# Patient Record
Sex: Female | Born: 1950
Health system: Southern US, Community
[De-identification: ages and names within clinical notes are randomized; demographics above are authoritative.]

## PROBLEM LIST (undated history)

## (undated) DIAGNOSIS — M1990 Unspecified osteoarthritis, unspecified site: Secondary | ICD-10-CM

## (undated) DIAGNOSIS — S82209A Unspecified fracture of shaft of unspecified tibia, initial encounter for closed fracture: Secondary | ICD-10-CM

## (undated) DIAGNOSIS — Z98891 History of uterine scar from previous surgery: Secondary | ICD-10-CM

## (undated) DIAGNOSIS — H269 Unspecified cataract: Secondary | ICD-10-CM

## (undated) DIAGNOSIS — R000 Tachycardia, unspecified: Secondary | ICD-10-CM

## (undated) DIAGNOSIS — E785 Hyperlipidemia, unspecified: Secondary | ICD-10-CM

## (undated) DIAGNOSIS — C4490 Unspecified malignant neoplasm of skin, unspecified: Secondary | ICD-10-CM

## (undated) DIAGNOSIS — K5792 Diverticulitis of intestine, part unspecified, without perforation or abscess without bleeding: Secondary | ICD-10-CM

## (undated) DIAGNOSIS — IMO0002 Reserved for concepts with insufficient information to code with codable children: Secondary | ICD-10-CM

## (undated) DIAGNOSIS — C50919 Malignant neoplasm of unspecified site of unspecified female breast: Secondary | ICD-10-CM

## (undated) DIAGNOSIS — T7840XA Allergy, unspecified, initial encounter: Secondary | ICD-10-CM

## (undated) DIAGNOSIS — E669 Obesity, unspecified: Secondary | ICD-10-CM

## (undated) DIAGNOSIS — I10 Essential (primary) hypertension: Secondary | ICD-10-CM

## (undated) DIAGNOSIS — R011 Cardiac murmur, unspecified: Secondary | ICD-10-CM

## (undated) DIAGNOSIS — K219 Gastro-esophageal reflux disease without esophagitis: Secondary | ICD-10-CM

## (undated) HISTORY — DX: Unspecified malignant neoplasm of skin, unspecified: C44.90

## (undated) HISTORY — PX: OTHER SURGICAL HISTORY: SHX169

## (undated) HISTORY — DX: Reserved for concepts with insufficient information to code with codable children: IMO0002

## (undated) HISTORY — DX: History of uterine scar from previous surgery: Z98.891

## (undated) HISTORY — DX: Gastro-esophageal reflux disease without esophagitis: K21.9

## (undated) HISTORY — DX: Unspecified fracture of shaft of unspecified tibia, initial encounter for closed fracture: S82.209A

## (undated) HISTORY — DX: Tachycardia, unspecified: R00.0

## (undated) HISTORY — DX: Cardiac murmur, unspecified: R01.1

## (undated) HISTORY — DX: Malignant neoplasm of unspecified site of unspecified female breast: C50.919

## (undated) HISTORY — DX: Allergy, unspecified, initial encounter: T78.40XA

## (undated) HISTORY — DX: Essential (primary) hypertension: I10

## (undated) HISTORY — DX: Unspecified cataract: H26.9

## (undated) HISTORY — DX: Obesity, unspecified: E66.9

## (undated) HISTORY — PX: DILATION AND CURETTAGE OF UTERUS: SHX78

## (undated) HISTORY — DX: Hyperlipidemia, unspecified: E78.5

## (undated) HISTORY — DX: Unspecified osteoarthritis, unspecified site: M19.90

## (undated) HISTORY — DX: Diverticulitis of intestine, part unspecified, without perforation or abscess without bleeding: K57.92

---

## 1981-05-18 DIAGNOSIS — Z98891 History of uterine scar from previous surgery: Secondary | ICD-10-CM

## 1981-05-18 HISTORY — DX: History of uterine scar from previous surgery: Z98.891

## 1995-10-14 DIAGNOSIS — IMO0001 Reserved for inherently not codable concepts without codable children: Secondary | ICD-10-CM

## 1995-10-14 HISTORY — DX: Reserved for inherently not codable concepts without codable children: IMO0001

## 1996-01-12 DIAGNOSIS — C50919 Malignant neoplasm of unspecified site of unspecified female breast: Secondary | ICD-10-CM

## 1996-01-12 HISTORY — DX: Malignant neoplasm of unspecified site of unspecified female breast: C50.919

## 1996-01-12 HISTORY — PX: BREAST BIOPSY: SHX20

## 1997-10-13 HISTORY — PX: MASTECTOMY: SHX3

## 2006-10-13 HISTORY — PX: COLONOSCOPY: SHX174

## 2010-06-04 ENCOUNTER — Ambulatory Visit: Admission: RE | Admit: 2010-06-04 | Discharge: 2010-06-04 | Payer: Self-pay | Admitting: Gynecologic Oncology

## 2010-06-13 DIAGNOSIS — IMO0002 Reserved for concepts with insufficient information to code with codable children: Secondary | ICD-10-CM

## 2010-06-13 HISTORY — PX: LAPAROSCOPIC HYSTERECTOMY: SHX1926

## 2010-06-13 HISTORY — DX: Reserved for concepts with insufficient information to code with codable children: IMO0002

## 2010-06-25 ENCOUNTER — Encounter: Payer: Self-pay | Admitting: Obstetrics & Gynecology

## 2010-06-25 ENCOUNTER — Ambulatory Visit (HOSPITAL_COMMUNITY): Admission: RE | Admit: 2010-06-25 | Discharge: 2010-06-26 | Payer: Self-pay | Admitting: Obstetrics & Gynecology

## 2010-08-01 ENCOUNTER — Ambulatory Visit: Admission: RE | Admit: 2010-08-01 | Discharge: 2010-08-01 | Payer: Self-pay | Admitting: Gynecologic Oncology

## 2010-12-26 LAB — CBC
Hemoglobin: 12.2 g/dL (ref 12.0–15.0)
Hemoglobin: 14.1 g/dL (ref 12.0–15.0)
MCH: 30.8 pg (ref 26.0–34.0)
MCV: 88.5 fL (ref 78.0–100.0)
RBC: 3.95 MIL/uL (ref 3.87–5.11)
RBC: 4.58 MIL/uL (ref 3.87–5.11)
WBC: 20.5 10*3/uL — ABNORMAL HIGH (ref 4.0–10.5)

## 2010-12-26 LAB — COMPREHENSIVE METABOLIC PANEL
ALT: 17 U/L (ref 0–35)
AST: 19 U/L (ref 0–37)
CO2: 27 mEq/L (ref 19–32)
Chloride: 103 mEq/L (ref 96–112)
GFR calc Af Amer: >60 mL/min (ref >60)
GFR calc non Af Amer: >60 mL/min (ref >60)
Potassium: 3.9 mEq/L (ref 3.5–5.1)
Sodium: 139 mEq/L (ref 135–145)
Total Bilirubin: 0.6 mg/dL (ref 0.3–1.2)

## 2010-12-26 LAB — SURGICAL PCR SCREEN: Staphylococcus aureus: NEGATIVE

## 2010-12-26 LAB — TYPE AND SCREEN

## 2010-12-26 LAB — DIFFERENTIAL
Basophils Absolute: 0 10*3/uL (ref 0.0–0.1)
Eosinophils Absolute: 0.2 10*3/uL (ref 0.0–0.7)
Eosinophils Relative: 3 % (ref 0–5)

## 2011-07-30 ENCOUNTER — Ambulatory Visit: Payer: BC Managed Care – PPO | Attending: Gynecologic Oncology | Admitting: Gynecologic Oncology

## 2011-07-30 DIAGNOSIS — Z9079 Acquired absence of other genital organ(s): Secondary | ICD-10-CM | POA: Insufficient documentation

## 2011-07-30 DIAGNOSIS — Z90710 Acquired absence of both cervix and uterus: Secondary | ICD-10-CM | POA: Insufficient documentation

## 2011-07-30 DIAGNOSIS — Z7982 Long term (current) use of aspirin: Secondary | ICD-10-CM | POA: Insufficient documentation

## 2011-07-30 DIAGNOSIS — C549 Malignant neoplasm of corpus uteri, unspecified: Secondary | ICD-10-CM | POA: Insufficient documentation

## 2011-07-30 DIAGNOSIS — Z79899 Other long term (current) drug therapy: Secondary | ICD-10-CM | POA: Insufficient documentation

## 2011-08-01 NOTE — Consult Note (Signed)
Paige Mccann, Paige Mccann               ACCOUNT NO.:  [id number]  MEDICAL RECORD NO.:  [id number]  LOCATION:  GYN                          FACILITY:  [facility]  PHYSICIAN:  Paige Poitras A. Duard Brady, MD    DATE OF BIRTH:  [birth year]  DATE OF CONSULTATION:  07/30/2011 DATE OF DISCHARGE:                                CONSULTATION   Paige Mccann is a very pleasant 60 year old, who presented to Paige Mccann for pink vaginal discharge.  Evaluation included ultrasound, which demonstrated an heterogeneous echotexture of the uterus measuring approximately 2 cm.  Paige Mccann performed an endometrial biopsy that revealed a grade 1 endometrioid adenocarcinoma.  In September 2011, she underwent laparoscopic hysterectomy, BSO, repair of vaginal laceration. Intraoperative frozen section revealed an endometrial polyp with minimal to no myometrial invasion of the grade 1 lesion.  Final pathology was consistent with a FIGO grade 2 carcinoma confined to be inner half of the endometrium measuring 1 mm out of 15 mm.  There is no lymphovascular space involvement.  Peritoneal washings were negative.  We discussed her pathology and based on the clinical risk factors, she was a clinical stage IA grade 2 with minimal myometrial invasion.  She was dispositioned to close followup.  She comes in today, she is overall doing quite well.  She does complain of some back pain with her bowel movements.  She has had this since before her surgery.  After her bowel movements, the back pain does get better.  This is primarily in the morning when she wakes up.  She also notices some sciatic type pain in her left leg.  Again, is worse in the mornings when she wakes up, and then it seems to get a little bit better.  She did see Paige Mccann since we last saw her in August of last year and she states her exam and Pap smear were negative.  She denies any vaginal bleeding.  She does have occasional urinary frequency.  She occasionally sounds like she has  some clear discharge after her void is difficult to ascertain if she is not emptying her bladder completely and when she stands up after voiding of small amount of urine leaks.  She is regularly seen by her primary care physician.  Otherwise, offers no complaints.  MEDICATIONS: 1. Enalapril 20 mg daily. 2. Hydrochlorothiazide 25 mg one-half tablet in the morning. 3. Metoprolol 25 mg, one pill in the morning and one pill at dinner. 4. Prevacid 1 pill daily. 5. Baby aspirin. 6. TriCor 145 mg daily.  ALLERGIES: 1. CODEINE causes her to feel weird. 2. PREDNISONE makes her feel wired. 3. CIPROFLOXACIN causes dizziness. 4. NEOSPORIN causes itching and redness.  PHYSICAL EXAMINATION:  VITAL SIGNS:  Weight 224 pounds, which is up 3 pounds from her last visit.  Blood pressure 120/80, temperature 97.9, respirations 16. GENERAL:  Well-nourished, well-developed female, in no acute distress. NECK:  Supple.  There is no lymphadenopathy.  No thyromegaly. LUNGS:  Clear to auscultation bilaterally. CARDIOVASCULAR EXAM:  Regular rate and rhythm. ABDOMEN:  Shows well-healed surgical incisions.  Abdomen is soft, nontender, and nondistended.  There are no palpable masses or hepatosplenomegaly, but exam is somewhat limited by habitus.  Groins are negative for adenopathy. EXTREMITIES:  Shows 1 to 2+ nonpitting edema and equal bilaterally. PELVIC:  External genitalia is within normal limits.  The vagina is markedly atrophic.  The vaginal cuff is visualized.  There is no visible lesions.  ThinPrep Pap was submitted without difficulty. BIMANUAL EXAMINATION:  Reveals no masses or nodularity. RECTAL:  Confirms.  ASSESSMENT:  A 60 year old with a stage IA grade 2 endometrioid adenocarcinoma, status post laparoscopic hysterectomy, BSO, and hernia repair in September 2011, who clinically has no evidence of recurrent disease.  PLAN:  We will follow up results for Pap smear from today.  She will see [place] in  1 year and return to see Paige Mccann in 6 months.  She will continue following up with her other physicians as scheduled.     Paige Vecchiarelli A. Duard Brady, MD     PAG/MEDQ  D:  07/30/2011  T:  07/30/2011  Job:  [id number]  cc:   Paige Mccann, M.D. Fax: [phone]  Paige Mccann, R.N. [address] [place], [place] [address]  Paige Mccann  Paige Mccann  Electronically Signed by Paige Mccreedy MD on 08/01/2011 12:04:24 PM

## 2012-01-13 ENCOUNTER — Other Ambulatory Visit: Payer: Self-pay | Admitting: Physician Assistant

## 2012-07-19 ENCOUNTER — Encounter: Payer: Self-pay | Admitting: Gynecologic Oncology

## 2012-07-21 ENCOUNTER — Encounter: Payer: Self-pay | Admitting: Gynecologic Oncology

## 2012-07-21 ENCOUNTER — Ambulatory Visit: Payer: BC Managed Care – PPO | Attending: Gynecologic Oncology | Admitting: Gynecologic Oncology

## 2012-07-21 VITALS — BP 118/68 | HR 82 | Temp 98.1°F | Resp 20 | Ht 63.23 in | Wt 218.4 lb

## 2012-07-21 DIAGNOSIS — Z85828 Personal history of other malignant neoplasm of skin: Secondary | ICD-10-CM | POA: Insufficient documentation

## 2012-07-21 DIAGNOSIS — Z90710 Acquired absence of both cervix and uterus: Secondary | ICD-10-CM | POA: Insufficient documentation

## 2012-07-21 DIAGNOSIS — I10 Essential (primary) hypertension: Secondary | ICD-10-CM | POA: Insufficient documentation

## 2012-07-21 DIAGNOSIS — E669 Obesity, unspecified: Secondary | ICD-10-CM | POA: Insufficient documentation

## 2012-07-21 DIAGNOSIS — Z9079 Acquired absence of other genital organ(s): Secondary | ICD-10-CM | POA: Insufficient documentation

## 2012-07-21 DIAGNOSIS — Z853 Personal history of malignant neoplasm of breast: Secondary | ICD-10-CM | POA: Insufficient documentation

## 2012-07-21 DIAGNOSIS — C541 Malignant neoplasm of endometrium: Secondary | ICD-10-CM

## 2012-07-21 DIAGNOSIS — Z923 Personal history of irradiation: Secondary | ICD-10-CM | POA: Insufficient documentation

## 2012-07-21 DIAGNOSIS — Z79899 Other long term (current) drug therapy: Secondary | ICD-10-CM | POA: Insufficient documentation

## 2012-07-21 DIAGNOSIS — C549 Malignant neoplasm of corpus uteri, unspecified: Secondary | ICD-10-CM | POA: Insufficient documentation

## 2012-07-21 DIAGNOSIS — Z7982 Long term (current) use of aspirin: Secondary | ICD-10-CM | POA: Insufficient documentation

## 2012-07-21 NOTE — Progress Notes (Signed)
Consult Note: Gyn-Onc  Paige Mccann 61 y.o. female  CC:  Chief Complaint  Patient presents with  . Endometrial adenocarcinoma    Follow up    HPI: Paige Mccann is a very pleasant 61 year old, who presented to Dr. Rana Snare for pink vaginal discharge. Evaluation included ultrasound, which  demonstrated an heterogeneous echotexture of the uterus measuring approximately 2 cm. Dr. Rana Snare performed an endometrial biopsy that  revealed a grade 1 endometrioid adenocarcinoma. In September 2011, she underwent laparoscopic hysterectomy, BSO, repair of vaginal laceration. Intraoperative frozen section revealed an endometrial polyp with minimal to no myometrial invasion of the grade 1 lesion. Final pathology was consistent with a FIGO grade 2 carcinoma confined to be inner half of the endometrium measuring 1 mm out of 15 mm. There is no lymphovascular space involvement. Peritoneal washings were negative.   We discussed her pathology and based on the clinical risk factors, she was a clinical stage IA grade 2 with minimal myometrial invasion.  She was dispositioned to close followup.  Interval History:  She comes in today, she is overall doing quite well.   Review of Systems She does complain of some back pain with her bowel movements. She did see Dr. Rana Snare since we last saw her in October of last year and she states her exam and Pap smear were negative. She denies any vaginal bleeding. She does have occasional urinary frequency. She is exercising about 140 minutes per week. She is using a stationary bike. She has some tendon pain in the back of her left knee which limits how long she can walk or bike. She is taking chondriotin.  Her mother has Alzheimers and they are helping to care for her. She is regularly seen by her primary care physician. Otherwise, offers no complaints. MMG is up to date and her colonoscopy was 5 years ago.  10 point ROS is otherwise negative.  Current Meds:  Outpatient Encounter  Prescriptions as of 07/21/2012  Medication Sig Dispense Refill  . aspirin 81 MG tablet Take 81 mg by mouth daily.      . enalapril (VASOTEC) 20 MG tablet Take 20 mg by mouth daily.      . hydrochlorothiazide (HYDRODIURIL) 25 MG tablet Take 12.5 mg by mouth daily.      . Lansoprazole (PREVACID PO) Take 1 tablet by mouth daily.      . metoprolol tartrate (LOPRESSOR) 25 MG tablet Take 25 mg by mouth 2 (two) times daily.      Marland Kitchen DISCONTD: fenofibrate (TRICOR) 145 MG tablet Take 145 mg by mouth daily.        Allergy:  Allergies  Allergen Reactions  . Ciprofloxacin     Dizziness  . Codeine     "feel weird"  . Neosporin (Neomycin-Bacitracin Zn-Polymyx) Itching    redness  . Prednisone     "feel wired"    Social Hx:   History   Social History  . Marital Status: Married    Spouse Name: N/A    Number of Children: N/A  . Years of Education: N/A   Occupational History  . Not on file.   Social History Main Topics  . Smoking status: Never Smoker   . Smokeless tobacco: Not on file  . Alcohol Use: Yes     onec or twice a year  . Drug Use: No  . Sexually Active: Not on file   Other Topics Concern  . Not on file   Social History Narrative  . No narrative  on file    Past Surgical Hx:  Past Surgical History  Procedure Date  . Laparoscopic hysterectomy 06/2010    BSO, repair of vaginal laceration, hernia repair  . Dilation and curettage of uterus 1993, 2003  . Breast biopsy 01/1996  . Mastectomy 1999  . Skin cancer removal 2005, 2007    basal cell and squamous cell    Past Medical Hx:  Past Medical History  Diagnosis Date  . Endometrioid adenocarcinoma 06/2010    Stage IA, grade 2  . H/O cesarean section 05/18/1981  . Radiation 1997    for breast ca  . Breast cancer 01/1996    with recurrence in 1999  . Hypertension   . Obesity   . Rapid heart rate     Family Hx:  Family History  Problem Relation Age of Onset  . Adopted: Yes    Vitals:  Blood pressure 118/68,  pulse 82, temperature 98.1 F (36.7 C), temperature source Oral, resp. rate 20, height 5' 3.23" (1.606 m), weight 218 lb 6.4 oz (99.066 kg).  Physical Exam:  GENERAL: Well-nourished, well-developed female, in no acute distress.   NECK: Supple. There is no lymphadenopathy. No thyromegaly.   LUNGS: Clear to auscultation bilaterally.   CARDIOVASCULAR EXAM: Regular rate and rhythm.   ABDOMEN: Shows well-healed surgical incisions. Abdomen is soft, nontender, and nondistended. There are no palpable masses or  hepatosplenomegaly, but exam is somewhat limited by habitus. Groins are negative for adenopathy.   EXTREMITIES: Shows 1 to 2+ nonpitting edema and equal bilaterally.   PELVIC: External genitalia is within normal limits. The vagina is markedly atrophic. The vaginal cuff is visualized. There is no visible  lesions.    BIMANUAL EXAMINATION: Reveals no masses or nodularity.   RECTAL: Confirms.  Assessment/Plan: A 61 year old with a stage IA grade 2 endometrioid adenocarcinoma, status post laparoscopic hysterectomy, BSO, and hernia  repair in September 2011, who clinically has no evidence of recurrent disease.   PLAN: She will see [place] in 1 year and return to see Dr. Rana Snare in 6 months. She will continue following up with her other physicians as scheduled.  Cc: Drs. Derinda Late.   Cleda Mccreedy A., MD 07/21/2012, 2:33 PM

## 2012-07-21 NOTE — Patient Instructions (Signed)
RTC one year and follow up with Dr. Rana Snare in 6 months.

## 2013-07-28 ENCOUNTER — Ambulatory Visit: Payer: BC Managed Care – PPO | Attending: Gynecologic Oncology | Admitting: Gynecologic Oncology

## 2013-07-28 ENCOUNTER — Encounter: Payer: Self-pay | Admitting: Gynecologic Oncology

## 2013-07-28 VITALS — BP 152/83 | HR 90 | Temp 97.8°F | Resp 18 | Ht 63.23 in | Wt 224.0 lb

## 2013-07-28 DIAGNOSIS — Z90710 Acquired absence of both cervix and uterus: Secondary | ICD-10-CM | POA: Insufficient documentation

## 2013-07-28 DIAGNOSIS — C549 Malignant neoplasm of corpus uteri, unspecified: Secondary | ICD-10-CM | POA: Insufficient documentation

## 2013-07-28 DIAGNOSIS — I10 Essential (primary) hypertension: Secondary | ICD-10-CM | POA: Insufficient documentation

## 2013-07-28 DIAGNOSIS — Z853 Personal history of malignant neoplasm of breast: Secondary | ICD-10-CM | POA: Insufficient documentation

## 2013-07-28 DIAGNOSIS — C541 Malignant neoplasm of endometrium: Secondary | ICD-10-CM

## 2013-07-28 DIAGNOSIS — Z9079 Acquired absence of other genital organ(s): Secondary | ICD-10-CM | POA: Insufficient documentation

## 2013-07-28 NOTE — Patient Instructions (Addendum)
Followup with Dr. Rana Snare in 6 months and GYN oncology in one year.  Please call in June or July 2015 to schedule an appointment for October 2015.

## 2013-07-28 NOTE — Progress Notes (Signed)
Consult Note: Gyn-Onc  Paige Mccann 62 y.o. female  CC:  Chief Complaint  Patient presents with  . Endometrial adenocarcinoma    Follow up    HPI: Paige Mccann is a very pleasant 62 year old, who presented to Dr. Rana Snare for pink vaginal discharge. Evaluation included ultrasound, which  demonstrated an heterogeneous echotexture of the uterus measuring approximately 2 cm. Dr. Rana Snare performed an endometrial biopsy that  revealed a grade 1 endometrioid adenocarcinoma. In September 2011, she underwent laparoscopic hysterectomy, BSO, repair of vaginal laceration. Intraoperative frozen section revealed an endometrial polyp with minimal to no myometrial invasion of the grade 1 lesion. Final pathology was consistent with a FIGO grade 2 carcinoma confined to be inner half of the endometrium measuring 1 mm out of 15 mm. There is no lymphovascular space involvement. Peritoneal washings were negative.  We discussed her pathology and based on the clinical risk factors, she was a clinical stage IA grade 2 with minimal myometrial invasion. She was dispositioned to close followup  Interval History:  She is overall doing quite well. She's up-to-date on her mammograms. They're having increasing issues with her mother's dementia. The patient herself is adopted so there's no concerns for her from that perspective. She's having some increasing issues with arthritis in her left hip primarily with long walks her walking in the mall. This has been going on for about a year and is not significantly gotten worse. She has not experienced any pain when she is walking around her home.  Review of Systems  Constitutional: Denies fever. Skin: No rash, sores, jaundice, itching, or dryness.  Cardiovascular: No chest pain, shortness of breath, or edema  Pulmonary: No cough or wheeze.  Gastro Intestinal:  No nausea, vomiting, constipation, or diarrhea reported. No bright red blood per rectum or change in bowel movement.   Genitourinary: No frequency, urgency, or dysuria.  Denies vaginal bleeding and discharge.  Musculoskeletal: No myalgia, arthralgia, joint swelling or pain.  Neurologic: No weakness, numbness, or change in gait.  Psychology: No changes  Current Meds:  Outpatient Encounter Prescriptions as of 07/28/2013  Medication Sig Dispense Refill  . aspirin 81 MG tablet Take 81 mg by mouth daily.      . enalapril (VASOTEC) 20 MG tablet Take 20 mg by mouth daily.      Stephanie Acre (GLUCOSAMINE MSM COMPLEX) TABS Take 2 tablets by mouth 2 (two) times daily. Two pills morning and dinner      . hydrochlorothiazide (HYDRODIURIL) 25 MG tablet Take 12.5 mg by mouth daily.      . Lansoprazole (PREVACID PO) Take 1 tablet by mouth daily.      . metoprolol tartrate (LOPRESSOR) 25 MG tablet Take 25 mg by mouth 2 (two) times daily.      . Multiple Vitamins-Minerals (AIRBORNE PO) Take 1 tablet by mouth daily. 1 to 2 daily       No facility-administered encounter medications on file as of 07/28/2013.    Allergy:  Allergies  Allergen Reactions  . Ciprofloxacin     Dizziness  . Codeine     "feel weird"  . Neosporin [Neomycin-Bacitracin Zn-Polymyx] Itching    redness  . Prednisone     "feel wired"    Social Hx:   History   Social History  . Marital Status: Married    Spouse Name: N/A    Number of Children: N/A  . Years of Education: N/A   Occupational History  . Not on file.   Social History Main  Topics  . Smoking status: Never Smoker   . Smokeless tobacco: Not on file  . Alcohol Use: Yes     Comment: onec or twice a year  . Drug Use: No  . Sexual Activity: Not on file   Other Topics Concern  . Not on file   Social History Narrative  . No narrative on file    Past Surgical Hx:  Past Surgical History  Procedure Laterality Date  . Laparoscopic hysterectomy  06/2010    BSO, repair of vaginal laceration, hernia repair  . Dilation and curettage of uterus  1993, 2003  .  Breast biopsy  01/1996  . Mastectomy  1999  . Skin cancer removal  2005, 2007    basal cell and squamous cell    Past Medical Hx:  Past Medical History  Diagnosis Date  . Endometrioid adenocarcinoma 06/2010    Stage IA, grade 2  . H/O cesarean section 05/18/1981  . Radiation 1997    for breast ca  . Breast cancer 01/1996    with recurrence in 1999  . Hypertension   . Obesity   . Rapid heart rate     Oncology Hx:    Endometrial adenocarcinoma   06/25/2010 Initial Diagnosis Endometrial adenocarcinoma, IA grade 2 no LVSI    Family Hx:  Family History  Problem Relation Age of Onset  . Adopted: Yes    Vitals:  Blood pressure 152/83, pulse 90, temperature 97.8 F (36.6 C), temperature source Oral, resp. rate 18, height 5' 3.23" (1.606 m), weight 224 lb (101.606 kg).  Physical Exam: GENERAL: Well-nourished, well-developed female, in no acute distress.   NECK: Supple. There is no lymphadenopathy. No thyromegaly.   LUNGS: Clear to auscultation bilaterally.   CARDIOVASCULAR EXAM: Regular rate and rhythm.   ABDOMEN: Shows well-healed surgical incisions. Abdomen is soft, nontender, and nondistended. There are no palpable masses or hepatosplenomegaly, but exam is somewhat limited by habitus. Groins are negative for adenopathy.   EXTREMITIES: Shows 1 to 2+ nonpitting edema and equal bilaterally.   PELVIC: External genitalia is within normal limits. The vagina is markedly atrophic. The vaginal cuff is visualized. There is no visible lesions.   BIMANUAL EXAMINATION: Reveals no masses or nodularity.   RECTAL: Confirms.  Assessment/Plan:  A 62 year old with a stage IA grade 2 endometrioid adenocarcinoma, status post laparoscopic hysterectomy, BSO, and hernia repair in September 2011, who clinically has no evidence of recurrent disease.  PLAN: She will see [place] in 1 year and return to see Dr. Rana Snare in 6 months. She will continue following up with her other physicians as scheduled.  Cc:  Drs. Derinda Late.      Lamona Eimer A., MD 07/28/2013, 3:01 PM

## 2014-03-22 ENCOUNTER — Other Ambulatory Visit: Payer: Self-pay | Admitting: Obstetrics and Gynecology

## 2014-08-29 ENCOUNTER — Other Ambulatory Visit: Payer: Self-pay | Admitting: Obstetrics and Gynecology

## 2014-08-31 ENCOUNTER — Ambulatory Visit: Payer: BC Managed Care – PPO | Admitting: Gynecologic Oncology

## 2014-08-31 LAB — CYTOLOGY - PAP

## 2014-09-13 ENCOUNTER — Ambulatory Visit: Payer: BC Managed Care – PPO | Admitting: Gynecologic Oncology

## 2014-09-27 ENCOUNTER — Ambulatory Visit: Payer: BC Managed Care – PPO | Attending: Gynecologic Oncology | Admitting: Gynecologic Oncology

## 2014-09-27 ENCOUNTER — Encounter: Payer: Self-pay | Admitting: Gynecologic Oncology

## 2014-09-27 VITALS — BP 152/86 | HR 79 | Temp 97.5°F | Resp 20 | Ht 63.23 in | Wt 225.4 lb

## 2014-09-27 DIAGNOSIS — C541 Malignant neoplasm of endometrium: Secondary | ICD-10-CM

## 2014-09-27 NOTE — Progress Notes (Signed)
Patient scheduled to see [place] in error. She was just seen by her primary physician in November. She had a Pap smear with Dr. Corinna Capra over the summer that revealed atypical squamous cells of undetermined significance with negative HPV. He brought her back in November for repeat Pap smear that was normal. This appointment will be rescheduled total May or June 2016. She will not be charged for today's visit.

## 2015-03-13 ENCOUNTER — Encounter: Payer: Self-pay | Admitting: Gynecologic Oncology

## 2015-03-13 ENCOUNTER — Ambulatory Visit: Payer: BC Managed Care – PPO | Attending: Gynecologic Oncology | Admitting: Gynecologic Oncology

## 2015-03-13 VITALS — BP 144/79 | HR 87 | Temp 98.5°F | Resp 16 | Ht 63.0 in | Wt 232.6 lb

## 2015-03-13 DIAGNOSIS — Z08 Encounter for follow-up examination after completed treatment for malignant neoplasm: Secondary | ICD-10-CM | POA: Insufficient documentation

## 2015-03-13 DIAGNOSIS — Z90722 Acquired absence of ovaries, bilateral: Secondary | ICD-10-CM | POA: Insufficient documentation

## 2015-03-13 DIAGNOSIS — Z9010 Acquired absence of unspecified breast and nipple: Secondary | ICD-10-CM | POA: Insufficient documentation

## 2015-03-13 DIAGNOSIS — Z853 Personal history of malignant neoplasm of breast: Secondary | ICD-10-CM | POA: Insufficient documentation

## 2015-03-13 DIAGNOSIS — Z8542 Personal history of malignant neoplasm of other parts of uterus: Secondary | ICD-10-CM | POA: Diagnosis not present

## 2015-03-13 DIAGNOSIS — C541 Malignant neoplasm of endometrium: Secondary | ICD-10-CM

## 2015-03-13 DIAGNOSIS — Z90710 Acquired absence of both cervix and uterus: Secondary | ICD-10-CM | POA: Diagnosis not present

## 2015-03-13 DIAGNOSIS — Z923 Personal history of irradiation: Secondary | ICD-10-CM | POA: Insufficient documentation

## 2015-03-13 NOTE — Progress Notes (Signed)
Consult Note: Gyn-Onc  Paige Mccann 64 y.o. female  CC:  Chief Complaint  Patient presents with  . endometrial cancer    HPI: Paige Mccann is a very pleasant 64 year old, who presented to Dr. Corinna Capra for pink vaginal discharge. Evaluation included ultrasound, which demonstrated an heterogeneous echotexture of the uterus measuring approximately 2 cm. Dr. Corinna Capra performed an endometrial biopsy that revealed a grade 1 endometrioid adenocarcinoma. In September 2011, she underwent laparoscopic hysterectomy, BSO, repair of vaginal laceration. Intraoperative frozen section revealed an endometrial polyp with minimal to no myometrial invasion of the grade 1 lesion. Final pathology was consistent with a FIGO grade 2 carcinoma confined to be inner half of the endometrium measuring 1 mm out of 15 mm. There is no lymphovascular space involvement. Peritoneal washings were negative.   We discussed her pathology and based on the clinical risk factors, she was a clinical stage IA grade 2 with minimal myometrial invasion. She was dispositioned to close followup  Interval History:  I last saw her in December which time she was erroneously scheduled. Should a Pap smear with Dr. Corinna Capra in the summer of 2015 that revealed atypical squamous cells of undetermined significance with negative HPV. She had a repeat Pap smear in November that was negative.  Soon after we saw her she fell and broke her right leg slipping on a wet floor. 2 days after that her mother passed away. She did not require any surgery for her leg fracture she was seen by orthopedics and with immobilization and use of a walker it is healed well. She has gained weight as she is not quite as mobile as she was before that. She did have some spotting and bleeding about 2 days ago. She states her nails have gotten longer and she might have scratched herself. She is up-to-date on her mammograms. There are no new medical problems and her family.  Review of  Systems  Constitutional: Denies fever. Skin: No rash, sores, jaundice, itching, or dryness.  Cardiovascular: No chest pain, shortness of breath, or edema  Pulmonary: No cough or wheeze.  Gastro Intestinal:  No nausea, vomiting, constipation, or diarrhea reported. No bright red blood per rectum or change in bowel movement.  Genitourinary: No frequency, urgency, or dysuria.  Denies vaginal bleeding and discharge except as above.  Musculoskeletal: + Arthritis in both of her knees. The uterus to be that her left knee was worse in her right knee but the right knee is one that she recently broke. Neurologic: No weakness, numbness, or change in gait.  Psychology: No changes  Current Meds:  Outpatient Encounter Prescriptions as of 03/13/2015  Medication Sig  . aspirin 81 MG tablet Take 81 mg by mouth 2 (two) times daily.  . enalapril (VASOTEC) 20 MG tablet Take 20 mg by mouth daily.  Donnie Aho (GLUCOSAMINE MSM COMPLEX) TABS Take 2 tablets by mouth 2 (two) times daily. Two pills morning and dinner  . hydrochlorothiazide (HYDRODIURIL) 25 MG tablet Take 12.5 mg by mouth daily.  . Lansoprazole (PREVACID PO) Take 1 tablet by mouth daily.  . metoprolol tartrate (LOPRESSOR) 25 MG tablet Take 25 mg by mouth 2 (two) times daily.  . Multiple Vitamins-Minerals (AIRBORNE PO) Take 1 tablet by mouth daily. 1 to 2 daily   No facility-administered encounter medications on file as of 03/13/2015.    Allergy:  Allergies  Allergen Reactions  . Ciprofloxacin     Dizziness  . Codeine     "feel weird"  . Neosporin [  Neomycin-Bacitracin Zn-Polymyx] Itching    redness  . Prednisone     "feel wired"    Social Hx:   History   Social History  . Marital Status: Married    Spouse Name: N/A  . Number of Children: N/A  . Years of Education: N/A   Occupational History  . Not on file.   Social History Main Topics  . Smoking status: Never Smoker   . Smokeless tobacco: Not on file  .  Alcohol Use: Yes     Comment: onec or twice a year  . Drug Use: No  . Sexual Activity: Not on file   Other Topics Concern  . Not on file   Social History Narrative    Past Surgical Hx:  Past Surgical History  Procedure Laterality Date  . Laparoscopic hysterectomy  06/2010    BSO, repair of vaginal laceration, hernia repair  . Dilation and curettage of uterus  1993, 2003  . Breast biopsy  01/1996  . Mastectomy  1999  . Skin cancer removal  2005, 2007    basal cell and squamous cell    Past Medical Hx:  Past Medical History  Diagnosis Date  . Endometrioid adenocarcinoma 06/2010    Stage IA, grade 2  . H/O cesarean section 05/18/1981  . Radiation 1997    for breast ca  . Breast cancer 01/1996    with recurrence in 1999  . Hypertension   . Obesity   . Rapid heart rate     Oncology Hx:    Endometrial adenocarcinoma   06/25/2010 Initial Diagnosis Endometrial adenocarcinoma, IA grade 2 no LVSI    Family Hx:  Family History  Problem Relation Age of Onset  . Adopted: Yes    Vitals:  Blood pressure 144/79, pulse 87, temperature 98.5 F (36.9 C), temperature source Oral, resp. rate 16, height 5\' 3"  (1.6 m), weight 232 lb 9.6 oz (105.507 kg).  Physical Exam: GENERAL: Well-nourished, well-developed female, in no acute distress.   NECK: Supple. There is no lymphadenopathy. No thyromegaly.   LUNGS: Clear to auscultation bilaterally.   CARDIOVASCULAR EXAM: Regular rate and rhythm.   ABDOMEN: Shows well-healed surgical incisions. Abdomen is soft, nontender, and nondistended. There are no palpable masses or hepatosplenomegaly, but exam is somewhat limited by habitus. Groins are negative for adenopathy.   EXTREMITIES: Shows 1 to 2+ nonpitting edema and equal bilaterally.   PELVIC: External genitalia is within normal limits. The vagina is markedly atrophic. The vaginal cuff is visualized. There is no visible lesions.   BIMANUAL EXAMINATION: Reveals no masses or nodularity.    RECTAL: Confirms.   Assessment/Plan:  A 64 year old with a stage IA grade 2 endometrioid adenocarcinoma, status post laparoscopic hysterectomy, BSO, and hernia repair in September 2011, who clinically has no evidence of recurrent disease. I do not see any evidence of any external trauma or any lesions to explain the spotting that she had. She will notify us if she has any further episodes of bleeding.  PLAN: She will see [place] in 1 year and return to see Dr. Corinna Capra in 6 months. She will continue following up with her other physicians as scheduled.   Cc: Drs. Orvil Feil.      Nancy Marus A., MD 03/13/2015, 2:46 PM

## 2015-03-13 NOTE — Patient Instructions (Signed)
Follow up with Dr. Corinna Capra in 6 months and Dr. Alycia Rossetti in one year.

## 2015-03-15 ENCOUNTER — Ambulatory Visit: Payer: BC Managed Care – PPO | Admitting: Gynecologic Oncology

## 2015-04-20 ENCOUNTER — Telehealth: Payer: Self-pay | Admitting: *Deleted

## 2015-04-20 NOTE — Telephone Encounter (Signed)
Pt called requesting pap smear results.Notified pt pap smear normal

## 2015-05-02 ENCOUNTER — Ambulatory Visit: Payer: BC Managed Care – PPO | Attending: Internal Medicine | Admitting: Physical Therapy

## 2015-05-02 DIAGNOSIS — M25561 Pain in right knee: Secondary | ICD-10-CM | POA: Diagnosis not present

## 2015-05-02 DIAGNOSIS — Z9181 History of falling: Secondary | ICD-10-CM | POA: Diagnosis present

## 2015-05-02 DIAGNOSIS — R293 Abnormal posture: Secondary | ICD-10-CM | POA: Insufficient documentation

## 2015-05-02 NOTE — Patient Instructions (Signed)
   Saray Capasso PT, DPT, LAT, ATC  [facility] Phone: [phone]     

## 2015-05-02 NOTE — Therapy (Signed)
[place], [place], [address] Phone: [phone]   Fax:  [phone]  Physical Therapy Evaluation  Patient Details  Name: Paige Mccann MRN: [id number] Date of Birth: 14-Mar-1951 Referring Provider:  Burnard Bunting, MD  Encounter Date: 05/02/2015      PT End of Session - 05/02/15 1811    Visit Number 1   Number of Visits 12   Date for PT Re-Evaluation 06/13/15   PT Start Time 1630   PT Stop Time 1720   PT Time Calculation (min) 50 min   Activity Tolerance Patient tolerated treatment well   Behavior During Therapy [facility] for tasks assessed/performed      Past Medical History  Diagnosis Date  . Endometrioid adenocarcinoma 06/2010    Stage IA, grade 2  . H/O cesarean section 05/18/1981  . Radiation 1997    for breast ca  . Breast cancer 01/1996    with recurrence in 1999  . Hypertension   . Obesity   . Rapid heart rate     Past Surgical History  Procedure Laterality Date  . Laparoscopic hysterectomy  06/2010    BSO, repair of vaginal laceration, hernia repair  . Dilation and curettage of uterus  1993, 2003  . Breast biopsy  01/1996  . Mastectomy  1999  . Skin cancer removal  2005, 2007    basal cell and squamous cell    There were no vitals filed for this visit.  Visit Diagnosis:  Right knee pain - Plan: PT plan of care cert/re-cert  Abnormal posture - Plan: PT plan of care cert/re-cert  Risk for falls - Plan: PT plan of care cert/re-cert      Subjective Assessment - 05/02/15 1643    Subjective Pt is a 64 y.o F with CC RLE pain follow ina proximal tibia Jan 9th 2016. She reports that she doesn't feel it in the leg but reports feeling more pain from the osteoarthritis.    Patient is accompained by: Family member   Limitations Lifting;Standing;Walking;House hold activities   How long can you sit comfortably? unlimited   How long can you stand comfortably? 15 min   How long can you walk  comfortably? 20 min   Diagnostic tests x-ray 02/20/2015 reports that the break is looking good.   Patient Stated Goals to be able to walk with a SPC, to feel more balanced, increased strength   Currently in Pain? Yes   Pain Score 0-No pain  reports more weakness than pain   Pain Location Knee   Pain Orientation Right;Distal   Pain Descriptors / Indicators --  weakness   Pain Type Chronic pain   Pain Onset More than a month ago   Pain Frequency Intermittent   Aggravating Factors  prolong standing, stairs   Pain Relieving Factors sit down and rest            [facility] PT Assessment - 05/02/15 1650    Assessment   Medical Diagnosis R s/p Tibia fracture   Onset Date/Surgical Date 10/21/14   Hand Dominance Right   Next MD Visit 08/22/2015   Prior Therapy no   Precautions   Precautions None   Restrictions   Weight Bearing Restrictions No   Balance Screen   Has the patient fallen in the past 6 months No   Has the patient had a decrease in activity level because of a fear of falling?  No   Is the patient reluctant to leave  their home because of a fear of falling?  No   Home Environment   Living Environment Private residence   Living Arrangements Spouse/significant other   Available Help at Discharge Available 24 hours/day;Available PRN/intermittently   Type of Home House   Home Access Ramped entrance   Home Layout Two level   Alternate Level Stairs-Number of Steps 14   Alternate Level Stairs-Rails Right   Home Equipment [place] - single point;Walker - 2 wheels   Prior Function   Level of Independence Independent;Independent with basic ADLs   Vocation Retired   Leisure go to [employer], shopping, watch tv   Cognition   Overall Cognitive Status Within Functional Limits for tasks assessed   Observation/Other Assessments   Lower Extremity Functional Scale  31/80   Posture/Postural Control   Posture/Postural Control Postural limitations   Postural Limitations Rounded  Shoulders;Forward head   ROM / Strength   AROM / PROM / Strength AROM;PROM;Strength   AROM   AROM Assessment Site Knee   Right/Left Knee Right;Left   Right Knee Extension 122   Right Knee Flexion 0   Left Knee Extension 0   Left Knee Flexion 105   PROM   PROM Assessment Site Knee   Right/Left Knee Right;Left   Strength   Strength Assessment Site Knee   Right/Left Knee Right;Left   Right Knee Flexion 4/5   Right Knee Extension 4+/5   Left Knee Flexion 4+/5   Left Knee Extension 4+/5   Palpation   Patella mobility hypomobility in all planes with no pain during testing   Palpation comment tenderness located at the medial joint line with tenderness located at the pes anserine.    Special Tests    Special Tests Knee Special Tests   Knee Special tests  other;other2   other    Findings Negative  valgus   Side  Right   other   findings Negative  varus    Side Right  bil   Ambulation/Gait   Gait Pattern Step-through pattern;Antalgic  with RW   Berg Balance Test   Sit to Stand Able to stand using hands after several tries   Standing Unsupported Able to stand 2 minutes with supervision   Sitting with Back Unsupported but Feet Supported on Floor or Stool Able to sit safely and securely 2 minutes   Stand to Sit Controls descent by using hands   Transfers Able to transfer safely, definite need of hands   Standing Unsupported with Eyes Closed Able to stand 10 seconds with supervision   Standing Ubsupported with Feet Together Able to place feet together independently and stand 1 minute safely   From Standing, Reach Forward with Outstretched Arm Can reach forward >12 cm safely (5")   From Standing Position, Pick up Object from Floor Able to pick up shoe, needs supervision   From Standing Position, Turn to Look Behind Over each Shoulder Looks behind from both sides and weight shifts well   Turn 360 Degrees Able to turn 360 degrees safely one side only in 4 seconds or less   Standing  Unsupported, Alternately Place Feet on Step/Stool Able to complete 4 steps without aid or supervision   Standing Unsupported, One Foot in Front Able to plae foot ahead of the other independently and hold 30 seconds   Standing on One Leg Tries to lift leg/unable to hold 3 seconds but remains standing independently   Total Score 41  [place] Adult PT Treatment/Exercise - 05/02/15 1650    Knee/Hip Exercises: Standing   Other Standing Knee Exercises hip abd/ext x 10 ea  VC to keep to avoid external rotation during abd   Other Standing Knee Exercises sit to stand x 10  VC to avoid "plopping" and not to use hands   Knee/Hip Exercises: Supine   Bridges AROM;Strengthening;Both;1 set;5 reps  feared she may cramp in the R thigh                PT Education - 05/02/15 1810    Education provided Yes   Education Details evaluation findings, POC, Goals, HEP    Person(s) Educated Patient   Methods Explanation   Comprehension Verbalized understanding;Returned demonstration          PT Short Term Goals - 05/02/15 1818    PT SHORT TERM GOAL #1   Title pt will be I with basic HEP (05/23/2015)   Time 3   Period Weeks   Status New   PT SHORT TERM GOAL #2   Title pt will perform 10 MWT and obtain values to make long term goal for community amb    Time 3   Period Weeks   Status New           PT Long Term Goals - 05/02/15 1819    PT LONG TERM GOAL #1   Title upon discharge pt will be I all HEP given throughout (06/13/2015)   Time 6   Period Weeks   Status New   PT LONG TERM GOAL #2   Title pt will increase Berg score by > 5 points to assist with safety during walking and standing (06/13/2015)   Time 6   Period Weeks   Status New   PT LONG TERM GOAL #3   Title pt will be able to ambulate > 150 ft with LRAD for safety without having to rest or stop  to help with functional amb (06/13/2015)   Time 6   Period Weeks   Status New   PT LONG TERM GOAL #4    Title she will be able to maintain rhomber/tandem balance for > 20 sec to help with stability and safety (06/13/2015)   Time 6   Period Weeks   Status New   PT LONG TERM GOAL #5   Title pt will be able to verbalize and demonstrate techniques to avoid L knee/leg reinjury via gait mechanics, proper DME use, and HEP (06/13/2015)   Time 6   Period Weeks   Status New               Plan - 05/02/15 1814    Clinical Impression Statement Paige Mccann presents to OPPT with CC of R knee pain s/p a tibia fx that occure don 10/21/2014. She demonstrates functional AROM in the knee with functional strength during MMT with mild weakness with knee flexion due to pain during testing. She reported feeling unstable and has a fear of falling and currently ambulates with a RW. she scored a 41/56 on the berg indidcating mild/ moderate fall risk.  She would benefit from skilled physical therapy to improve functional mobility and safety by addressing the impairments below.    Pt will benefit from skilled therapeutic intervention in order to improve on the following deficits Decreased balance;Decreased activity tolerance;Decreased endurance;Difficulty walking;Pain;Improper body mechanics;Postural dysfunction;Decreased strength   Rehab Potential Good   PT Frequency 2x / week   PT Duration 6 weeks   PT Treatment/Interventions  ADLs/Self Care Home Management;Cryotherapy;Electrical Stimulation;Moist Heat;Ultrasound;Gait training;DME Instruction;Stair training;Functional mobility training;Therapeutic activities;Therapeutic exercise;Balance training;Neuromuscular re-education;Patient/family education;Manual techniques;Passive range of motion;Taping   PT Next Visit Plan assess response to HEP, CKC strengthening, balance training, gait training with LRAD modalities PRN, 10 MWT.    PT Home Exercise Plan see HEP   Consulted and Agree with Plan of Care Patient         Problem List Patient Active Problem List   Diagnosis Date  Noted  . Endometrial adenocarcinoma 07/21/2012   [name] PT, DPT, LAT, ATC  05/02/2015  6:34 PM    [facility] [address] [place], [place], [address] Phone: [phone]   Fax:  [phone]

## 2015-05-22 ENCOUNTER — Ambulatory Visit: Payer: BC Managed Care – PPO | Attending: Internal Medicine | Admitting: Physical Therapy

## 2015-05-22 DIAGNOSIS — Z9181 History of falling: Secondary | ICD-10-CM | POA: Insufficient documentation

## 2015-05-22 DIAGNOSIS — M25561 Pain in right knee: Secondary | ICD-10-CM | POA: Insufficient documentation

## 2015-05-22 DIAGNOSIS — R293 Abnormal posture: Secondary | ICD-10-CM | POA: Diagnosis present

## 2015-05-22 NOTE — Therapy (Signed)
[place], [place], [address] Phone: [phone]   Fax:  [phone]  Physical Therapy Treatment  Patient Details  Name: Paige Mccann MRN: [id number] Date of Birth: September 11, 1951 Referring Provider:  Burnard Bunting, MD  Encounter Date: 05/22/2015      PT End of Session - 05/22/15 1805    Visit Number 2   Number of Visits 12   Date for PT Re-Evaluation 06/13/15   PT Start Time 0037   PT Stop Time 1630   PT Time Calculation (min) 45 min   Activity Tolerance Patient tolerated treatment well   Behavior During Therapy [facility] for tasks assessed/performed      Past Medical History  Diagnosis Date  . Endometrioid adenocarcinoma 06/2010    Stage IA, grade 2  . H/O cesarean section 05/18/1981  . Radiation 1997    for breast ca  . Breast cancer 01/1996    with recurrence in 1999  . Hypertension   . Obesity   . Rapid heart rate     Past Surgical History  Procedure Laterality Date  . Laparoscopic hysterectomy  06/2010    BSO, repair of vaginal laceration, hernia repair  . Dilation and curettage of uterus  1993, 2003  . Breast biopsy  01/1996  . Mastectomy  1999  . Skin cancer removal  2005, 2007    basal cell and squamous cell    There were no vitals filed for this visit.  Visit Diagnosis:  Right knee pain  Abnormal posture  Risk for falls      Subjective Assessment - 05/22/15 1553    Subjective "I've been keeping up with my HEP but I haven't been doing them as much as I should be"   Currently in Pain? No/denies   Pain Score 0-No pain   Pain Location Knee   Pain Orientation Right;Distal                         [facility] Adult PT Treatment/Exercise - 05/22/15 1555    Static Standing Balance   Tandem Stance - Right Leg 30  x 2 sets   Tandem Stance - Left Leg 30  x 2 sets   Rhomberg - Eyes Opened 30  x 1 set   Rhomberg - Eyes Closed 30  x 2 sets   Knee/Hip Exercises: Aerobic    Nustep L 5 x 8 min   Knee/Hip Exercises: Standing   Heel Raises Both;1 set;15 reps   Knee Flexion AROM;Strengthening   Forward Step Up 1 set;10 reps;Step Height: 4"   Gait Training walking with SPC coordinating left foot strike with cane, x 30 ft   Other Standing Knee Exercises hip abd/ext x 10 ea   performed bil with 3#   Knee/Hip Exercises: Seated   Long Arc Quad AROM;Strengthening;Both;2 sets;10 reps  4#   Long Arc Quad Weight 3 lbs.   Marching AROM;Strengthening;Both;2 sets;10 reps  3#   Sit to [employer] 10 reps;2 sets;without UE support  with table lowered between sets   Knee/Hip Exercises: Supine   Bridges AROM;Strengthening;Both;2 sets;10 reps   Straight Leg Raises AROM;Strengthening;Both;1 set;10 reps                PT Education - 05/22/15 1805    Education provided Yes   Education Details HEP review   Person(s) Educated Patient   Methods Explanation   Comprehension Verbalized understanding  PT Short Term Goals - 05/22/15 1807    PT SHORT TERM GOAL #1   Title pt will be I with basic HEP (05/23/2015)   Time 3   Period Weeks   Status On-going   PT SHORT TERM GOAL #2   Title pt will perform 10 MWT and obtain values to make long term goal for community amb    Time 3   Period Weeks   Status On-going           PT Long Term Goals - 05/22/15 1808    PT LONG TERM GOAL #1   Title upon discharge pt will be I all HEP given throughout (06/13/2015)   Time 6   Period Weeks   Status On-going   PT LONG TERM GOAL #2   Title pt will increase Berg score by > 5 points to assist with safety during walking and standing (06/13/2015)   Time 6   Period Weeks   Status On-going   PT LONG TERM GOAL #3   Title pt will be able to ambulate > 150 ft with LRAD for safety without having to rest or stop  to help with functional amb (06/13/2015)   Time 6   Period Weeks   Status On-going   PT LONG TERM GOAL #4   Title she will be able to maintain rhomber/tandem balance  for > 20 sec to help with stability and safety (06/13/2015)   Time 6   Period Weeks   Status On-going   PT LONG TERM GOAL #5   Title pt will be able to verbalize and demonstrate techniques to avoid L knee/leg reinjury via gait mechanics, proper DME use, and HEP (06/13/2015)   Time 6   Period Weeks   Status On-going               Plan - 05/22/15 1806    Clinical Impression Statement Paige Mccann reports that she has been doing her HEP but hasn't been as consistent with it as she could be. She was able to perform all exercises today wihtout compliant and was able to hold tandem/rhomberg balance for 30 seconds with minimal postural sway. educated that next visit wanted to see her come in using her [facility] for support instead of her RW.    PT Next Visit Plan  CKC strengthening, balance training, gait training with LRAD modalities PRN, 10 MWT   PT Home Exercise Plan HEP review   Consulted and Agree with Plan of Care Patient        Problem List Patient Active Problem List   Diagnosis Date Noted  . Endometrial adenocarcinoma 07/21/2012   [name] PT, DPT, LAT, ATC  05/22/2015  6:12 PM    [facility] [address] [place], [place], [address] Phone: [phone]   Fax:  [phone]

## 2015-05-24 ENCOUNTER — Ambulatory Visit: Payer: BC Managed Care – PPO | Admitting: Physical Therapy

## 2015-05-24 DIAGNOSIS — M25561 Pain in right knee: Secondary | ICD-10-CM | POA: Diagnosis not present

## 2015-05-24 DIAGNOSIS — R293 Abnormal posture: Secondary | ICD-10-CM

## 2015-05-24 NOTE — Therapy (Signed)
[place], [place], [address] Phone: [phone]   Fax:  [phone]  Physical Therapy Treatment  Patient Details  Name: Paige Mccann MRN: [id number] Date of Birth: 10/10/1951 Referring Provider:  Burnard Bunting, MD  Encounter Date: 05/24/2015      PT End of Session - 05/24/15 1424    Visit Number 3   Number of Visits 12   Date for PT Re-Evaluation 06/13/15   PT Start Time 1330   PT Stop Time 1417   PT Time Calculation (min) 47 min   Activity Tolerance Patient tolerated treatment well   Behavior During Therapy [facility] for tasks assessed/performed      Past Medical History  Diagnosis Date  . Endometrioid adenocarcinoma 06/2010    Stage IA, grade 2  . H/O cesarean section 05/18/1981  . Radiation 1997    for breast ca  . Breast cancer 01/1996    with recurrence in 1999  . Hypertension   . Obesity   . Rapid heart rate     Past Surgical History  Procedure Laterality Date  . Laparoscopic hysterectomy  06/2010    BSO, repair of vaginal laceration, hernia repair  . Dilation and curettage of uterus  1993, 2003  . Breast biopsy  01/1996  . Mastectomy  1999  . Skin cancer removal  2005, 2007    basal cell and squamous cell    There were no vitals filed for this visit.  Visit Diagnosis:  Abnormal posture  Right knee pain      Subjective Assessment - 05/24/15 1331    Subjective .Rt low back pain  2/10.  She is weaning from walker.  Kasandra Knudsen here today.   Currently in Pain? Yes   Pain Score 2    Pain Location Back   Pain Orientation Right;Lower   Aggravating Factors  Sleeping in the bed wrong.   Pain Relieving Factors moving around change of position.                         [place] Adult PT Treatment/Exercise - 05/24/15 1338    Ambulation/Gait   Assistive device Straight cane  and no assist device, cues trunk rotation .  Practice carryi   Gait Comments 164+ feet walking without  rest   Knee/Hip Exercises: Standing   Heel Raises Both;2 sets   Knee Flexion AROM;Both;1 set;10 reps   Lateral Step Up Both;1 set;10 reps;Hand Hold: 2;Step Height: 4"   Forward Step Up 1 set;10 reps  Both   Knee/Hip Exercises: Supine   Bridges 10 reps  Both, cues breathing   Bridges with Ball Squeeze 10 reps  cues   Knee/Hip Exercises: Sidelying   Hip ABduction 10 reps  each   Hip ADduction 10 reps  both cues   Manual Therapy   Manual therapy comments retrograde instruction                  PT Short Term Goals - 05/24/15 1426    PT SHORT TERM GOAL #1   Title pt will be I with basic HEP (05/23/2015)   Time 3   Period Weeks   Status On-going   PT SHORT TERM GOAL #2   Title pt will perform 10 MWT and obtain values to make long term goal for community amb    Time 3   Period Weeks   Status On-going  PT Long Term Goals - 05/24/15 1427    PT LONG TERM GOAL #1   Title upon discharge pt will be I all HEP given throughout (06/13/2015)   Time 6   Period Weeks   Status On-going   PT LONG TERM GOAL #2   Title pt will increase Berg score by > 5 points to assist with safety during walking and standing (06/13/2015)   Time 6   Period Weeks   Status Unable to assess   PT LONG TERM GOAL #3   Title pt will be able to ambulate > 150 ft with LRAD for safety without having to rest or stop  to help with functional amb (06/13/2015)   Baseline can do with cane   Time 6   Period Weeks   Status Partially Met   PT LONG TERM GOAL #4   Title she will be able to maintain rhomber/tandem balance for > 20 sec to help with stability and safety (06/13/2015)   Time 6   Period Weeks   Status Unable to assess   PT LONG TERM GOAL #5   Title pt will be able to verbalize and demonstrate techniques to avoid L knee/leg reinjury via gait mechanics, proper DME use, and HEP (06/13/2015)   Time 6   Period Weeks   Status Unable to assess               Plan - 05/24/15 1425     Clinical Impression Statement LTG progress for gait distance partially met.  gait much improved with cues.  Still needs Hip strengthening.   PT Next Visit Plan  CKC strengthening, balance training, gait training with LRAD modalities PRN, 10 MWT   Consulted and Agree with Plan of Care Family member/caregiver;Patient   Family Member Consulted husband        Problem List Patient Active Problem List   Diagnosis Date Noted  . Endometrial adenocarcinoma 07/21/2012    [facility] 05/24/2015, 2:42 PM  [facility] [address] [place], [place], [address] Phone: [phone]   Fax:  [phone]     Melvenia Needles, PTA 05/24/2015 2:42 PM Phone: [phone] Fax: [phone]

## 2015-05-29 ENCOUNTER — Ambulatory Visit: Payer: BC Managed Care – PPO | Admitting: Physical Therapy

## 2015-05-29 DIAGNOSIS — M25561 Pain in right knee: Secondary | ICD-10-CM | POA: Diagnosis not present

## 2015-05-29 DIAGNOSIS — Z9181 History of falling: Secondary | ICD-10-CM

## 2015-05-29 DIAGNOSIS — R293 Abnormal posture: Secondary | ICD-10-CM

## 2015-05-29 NOTE — Therapy (Addendum)
[place], [place], [address] Phone: [phone]   Fax:  [phone]  Physical Therapy Treatment  Patient Details  Name: Paige Mccann MRN: [id number] Date of Birth: 10-Feb-1951 Referring Provider:  Burnard Bunting, MD  Encounter Date: 05/29/2015      PT End of Session - 05/29/15 1629    Visit Number 4   Number of Visits 12   Date for PT Re-Evaluation 06/13/15   Activity Tolerance Patient tolerated treatment well   Behavior During Therapy [facility] for tasks assessed/performed      Past Medical History  Diagnosis Date  . Endometrioid adenocarcinoma 06/2010    Stage IA, grade 2  . H/O cesarean section 05/18/1981  . Radiation 1997    for breast ca  . Breast cancer 01/1996    with recurrence in 1999  . Hypertension   . Obesity   . Rapid heart rate     Past Surgical History  Procedure Laterality Date  . Laparoscopic hysterectomy  06/2010    BSO, repair of vaginal laceration, hernia repair  . Dilation and curettage of uterus  1993, 2003  . Breast biopsy  01/1996  . Mastectomy  1999  . Skin cancer removal  2005, 2007    basal cell and squamous cell    There were no vitals filed for this visit.  Visit Diagnosis:  Abnormal posture  Right knee pain  Risk for falls      Subjective Assessment - 05/29/15 1552    Subjective "I have been having more pain in the L knee that started Saturday, with difficulty putting weight on it or bending the knee"   Currently in Pain? Yes   Pain Score 5    Pain Location Knee   Pain Orientation Left   Pain Descriptors / Indicators --  unstable, shifting   Pain Onset More than a month ago   Pain Frequency Constant   Aggravating Factors  walking            [facility] PT Assessment - 05/29/15 0001    Special Tests    Special Tests Meniscus Tests   Meniscus Tests McMurray Test;other;Apley's Distraction;Apley's Compression   McMurray Test   Findings Positive   Side Left   Apley's Compression   Findings Negative   Apley's Distraction   Findings Negative   other   Findings Positive   Side Left   Comments thessaly test                               PT Short Term Goals - 05/24/15 1426    PT SHORT TERM GOAL #1   Title pt will be I with basic HEP (05/23/2015)   Time 3   Period Weeks   Status On-going   PT SHORT TERM GOAL #2   Title pt will perform 10 MWT and obtain values to make long term goal for community amb    Time 3   Period Weeks   Status On-going           PT Long Term Goals - 05/24/15 1427    PT LONG TERM GOAL #1   Title upon discharge pt will be I all HEP given throughout (06/13/2015)   Time 6   Period Weeks   Status On-going   PT LONG TERM GOAL #2   Title pt will increase Berg score by > 5 points to assist with safety  during walking and standing (06/13/2015)   Time 6   Period Weeks   Status Unable to assess   PT LONG TERM GOAL #3   Title pt will be able to ambulate > 150 ft with LRAD for safety without having to rest or stop  to help with functional amb (06/13/2015)   Baseline can do with cane   Time 6   Period Weeks   Status Partially Met   PT LONG TERM GOAL #4   Title she will be able to maintain rhomber/tandem balance for > 20 sec to help with stability and safety (06/13/2015)   Time 6   Period Weeks   Status Unable to assess   PT LONG TERM GOAL #5   Title pt will be able to verbalize and demonstrate techniques to avoid L knee/leg reinjury via gait mechanics, proper DME use, and HEP (06/13/2015)   Time 6   Period Weeks   Status Unable to assess               Plan - 05/29/15 1629    Clinical Impression Statement Paige Mccann presents to therapy today with report of pain in the L knee that started on Saturday morning. she currently ambulates with her RW demonstrating an antalgic gait pattern keeping her left knee straight. She reports popping, clicking, and feeling un stable. Special  testing was positive with mcmurrys and thessaly which in combination with symptology may suggest possible meniscal patholgy. Educated if the pain persists it may benefit her to return to her orthopedic physician for further assessment. pt understood and  agreed with todays assessment.  Due to pain in the Left knee and inabilty to use LLE and placing increased stress on R, only peformed education today.    PT Next Visit Plan  CKC strengthening, balance training, gait training with LRAD modalities PRN, 10 MWT   PT Home Exercise Plan HEP review   Consulted and Agree with Plan of Care Patient;Family member/caregiver   Family Member Consulted husband        Problem List Patient Active Problem List   Diagnosis Date Noted  . Endometrial adenocarcinoma 07/21/2012   [name] PT, DPT, LAT, ATC  05/29/2015  4:37 PM    [facility] [address] [place], [place], [address] Phone: [phone]   Fax:  [phone]          PHYSICAL THERAPY DISCHARGE SUMMARY  Visits from Start of Care: 4  Current functional level related to goals / functional outcomes: FOTO 58%   Remaining deficits: Knee pain, decreased balance   Education / Equipment: HEP, theraband for strengthening  Plan: Patient agrees to discharge.  Patient goals were not met. Patient is being discharged due to the patient's request.  ?????         Panagiotis Oelkers PT, DPT, LAT, ATC  06/26/2015  2:07 PM

## 2015-05-31 ENCOUNTER — Ambulatory Visit: Payer: BC Managed Care – PPO | Admitting: Physical Therapy

## 2015-06-04 ENCOUNTER — Ambulatory Visit: Payer: BC Managed Care – PPO | Admitting: Physical Therapy

## 2015-06-07 ENCOUNTER — Ambulatory Visit: Payer: BC Managed Care – PPO

## 2015-06-12 ENCOUNTER — Encounter: Payer: BC Managed Care – PPO | Admitting: Physical Therapy

## 2015-06-13 ENCOUNTER — Other Ambulatory Visit: Payer: Self-pay | Admitting: Obstetrics and Gynecology

## 2015-06-13 DIAGNOSIS — M7989 Other specified soft tissue disorders: Secondary | ICD-10-CM

## 2015-06-13 DIAGNOSIS — N644 Mastodynia: Secondary | ICD-10-CM

## 2015-06-14 ENCOUNTER — Encounter: Payer: BC Managed Care – PPO | Admitting: Physical Therapy

## 2015-06-20 ENCOUNTER — Ambulatory Visit
Admission: RE | Admit: 2015-06-20 | Discharge: 2015-06-20 | Disposition: A | Discharge status: Home / Self Care | Payer: BC Managed Care – PPO | Source: Ambulatory Visit | Attending: Obstetrics and Gynecology | Admitting: Obstetrics and Gynecology

## 2015-06-20 DIAGNOSIS — N644 Mastodynia: Secondary | ICD-10-CM

## 2015-06-20 DIAGNOSIS — M7989 Other specified soft tissue disorders: Secondary | ICD-10-CM

## 2016-05-06 DIAGNOSIS — I10 Essential (primary) hypertension: Secondary | ICD-10-CM | POA: Diagnosis not present

## 2016-05-06 DIAGNOSIS — R7301 Impaired fasting glucose: Secondary | ICD-10-CM | POA: Diagnosis not present

## 2016-05-06 DIAGNOSIS — R269 Unspecified abnormalities of gait and mobility: Secondary | ICD-10-CM | POA: Diagnosis not present

## 2016-05-06 DIAGNOSIS — E784 Other hyperlipidemia: Secondary | ICD-10-CM | POA: Diagnosis not present

## 2016-05-06 DIAGNOSIS — Z6841 Body Mass Index (BMI) 40.0 and over, adult: Secondary | ICD-10-CM | POA: Diagnosis not present

## 2016-06-11 DIAGNOSIS — I10 Essential (primary) hypertension: Secondary | ICD-10-CM | POA: Diagnosis not present

## 2016-06-11 DIAGNOSIS — E782 Mixed hyperlipidemia: Secondary | ICD-10-CM | POA: Diagnosis not present

## 2016-06-11 DIAGNOSIS — R011 Cardiac murmur, unspecified: Secondary | ICD-10-CM | POA: Diagnosis not present

## 2016-08-26 ENCOUNTER — Encounter: Payer: Self-pay | Admitting: Gynecologic Oncology

## 2016-08-26 ENCOUNTER — Other Ambulatory Visit (HOSPITAL_COMMUNITY)
Admission: RE | Admit: 2016-08-26 | Discharge: 2016-08-26 | Disposition: A | Discharge status: Home / Self Care | Payer: Medicare Other | Source: Ambulatory Visit | Attending: Gynecologic Oncology | Admitting: Gynecologic Oncology

## 2016-08-26 ENCOUNTER — Other Ambulatory Visit: Payer: Self-pay | Admitting: Gynecologic Oncology

## 2016-08-26 ENCOUNTER — Ambulatory Visit: Payer: Medicare Other | Attending: Gynecologic Oncology | Admitting: Gynecologic Oncology

## 2016-08-26 VITALS — BP 166/78 | HR 66 | Temp 97.8°F | Resp 18 | Ht 63.0 in | Wt 236.4 lb

## 2016-08-26 DIAGNOSIS — Z9010 Acquired absence of unspecified breast and nipple: Secondary | ICD-10-CM | POA: Insufficient documentation

## 2016-08-26 DIAGNOSIS — S8291XD Unspecified fracture of right lower leg, subsequent encounter for closed fracture with routine healing: Secondary | ICD-10-CM | POA: Insufficient documentation

## 2016-08-26 DIAGNOSIS — I10 Essential (primary) hypertension: Secondary | ICD-10-CM | POA: Diagnosis not present

## 2016-08-26 DIAGNOSIS — C541 Malignant neoplasm of endometrium: Secondary | ICD-10-CM | POA: Diagnosis not present

## 2016-08-26 DIAGNOSIS — X58XXXD Exposure to other specified factors, subsequent encounter: Secondary | ICD-10-CM | POA: Insufficient documentation

## 2016-08-26 DIAGNOSIS — Z853 Personal history of malignant neoplasm of breast: Secondary | ICD-10-CM | POA: Insufficient documentation

## 2016-08-26 DIAGNOSIS — Z124 Encounter for screening for malignant neoplasm of cervix: Secondary | ICD-10-CM | POA: Diagnosis not present

## 2016-08-26 DIAGNOSIS — Z888 Allergy status to other drugs, medicaments and biological substances status: Secondary | ICD-10-CM | POA: Diagnosis not present

## 2016-08-26 DIAGNOSIS — Z9889 Other specified postprocedural states: Secondary | ICD-10-CM | POA: Insufficient documentation

## 2016-08-26 DIAGNOSIS — S83207D Unspecified tear of unspecified meniscus, current injury, left knee, subsequent encounter: Secondary | ICD-10-CM | POA: Insufficient documentation

## 2016-08-26 DIAGNOSIS — E669 Obesity, unspecified: Secondary | ICD-10-CM | POA: Diagnosis not present

## 2016-08-26 DIAGNOSIS — Z7982 Long term (current) use of aspirin: Secondary | ICD-10-CM | POA: Insufficient documentation

## 2016-08-26 DIAGNOSIS — Z90710 Acquired absence of both cervix and uterus: Secondary | ICD-10-CM | POA: Insufficient documentation

## 2016-08-26 DIAGNOSIS — Z8542 Personal history of malignant neoplasm of other parts of uterus: Secondary | ICD-10-CM

## 2016-08-26 DIAGNOSIS — Z79899 Other long term (current) drug therapy: Secondary | ICD-10-CM | POA: Insufficient documentation

## 2016-08-26 NOTE — Progress Notes (Signed)
Consult Note: Gyn-Onc  Paige Mccann 65 y.o. female  CC:  Chief Complaint  Patient presents with  . endometrial adenocarcinoma    follow up    HPI: Primary physician Dr. Joya Salm at [facility]; referring physician Dr. Louretta Shorten  Ms. Paige Mccann is a very pleasant 65 year old, who presented to Dr. Corinna Capra for pink vaginal discharge. Evaluation included ultrasound, which demonstrated an heterogeneous echotexture of the uterus measuring approximately 2 cm. Dr. Corinna Capra performed an endometrial biopsy that revealed a grade 1 endometrioid adenocarcinoma. In September 2011, she underwent laparoscopic hysterectomy, BSO, repair of vaginal laceration. Intraoperative frozen section revealed an endometrial polyp with minimal to no myometrial invasion of the grade 1 lesion. Final pathology was consistent with a FIGO grade 2 carcinoma confined to be inner half of the endometrium measuring 1 mm out of 15 mm. There is no lymphovascular space involvement. Peritoneal washings were negative.   We discussed her pathology and based on the clinical risk factors, she was a clinical stage IA grade 2 with minimal myometrial invasion. She was dispositioned to close followup  Interval History:  I last saw her in May 2016 at which time her exam was unremarkable. The last Pap smear I have is from November 2015 that was negative. The patient does state however that she saw Dr. Corinna Capra last year and had a negative exam and negative Pap smear. She's overall doing quite well. She does complain of some vaginal itching which is chronic and 1 on for several years and she uses steroid cream intermittently. Occasionally she'll have some bleeding if she scratches too hard. She will complete have occasional abdominal discomfort and low back pain before she has a bowel movement every morning. She has no issues with her bladder. She did break her right leg since we last saw her and she also has a torn meniscus in her left. She is under the care  of Dr. Dennie Maizes orthopedics. She is up-to-date on her mammograms and is due for one in January. There are no new medical problems and her family.  Review of Systems: Constitutional: Denies fever. Skin: Itching as above, tick bite over umbilicus.  Cardiovascular: No chest pain, shortness of breath, or edema  Pulmonary: No cough or wheeze.  Gastro Intestinal: Reporting intermittent lower abdominal soreness as above.  No nausea, vomiting, constipation, or diarrhea reported.  Genitourinary: Denies vaginal bleeding and discharge.  Musculoskeletal: Ortho issues as above.    Current Meds:  Outpatient Encounter Prescriptions as of 08/26/2016  Medication Sig Dispense Refill  . aspirin 81 MG tablet Take 81 mg by mouth 2 (two) times daily.    . enalapril (VASOTEC) 20 MG tablet Take 20 mg by mouth daily.    Donnie Aho (GLUCOSAMINE MSM COMPLEX) TABS Take 1 tablet by mouth 2 (two) times daily. Two pills morning and dinner    . hydrochlorothiazide (HYDRODIURIL) 25 MG tablet Take 12.5 mg by mouth daily.    . Lansoprazole (PREVACID PO) Take 1 tablet by mouth daily.    . metoprolol tartrate (LOPRESSOR) 25 MG tablet Take 25 mg by mouth 2 (two) times daily.    . Multiple Vitamins-Minerals (AIRBORNE) CHEW Chew 1 tablet by mouth as needed.    . [DISCONTINUED] Multiple Vitamins-Minerals (AIRBORNE PO) Take 1 tablet by mouth daily. 1 to 2 daily     No facility-administered encounter medications on file as of 08/26/2016.     Allergy:  Allergies  Allergen Reactions  . Cefdinir Other (See Comments)    weakness  .  Ciprofloxacin     Dizziness  . Codeine     "feel weird"  . Neosporin [Neomycin-Bacitracin Zn-Polymyx] Itching    redness  . Prednisone     "feel wired"    Social Hx:   Social History   Social History  . Marital status: Married    Spouse name: N/A  . Number of children: N/A  . Years of education: N/A   Occupational History  . Not on file.   Social History Main  Topics  . Smoking status: Never Smoker  . Smokeless tobacco: Never Used  . Alcohol use Yes     Comment: onec or twice a year  . Drug use: No  . Sexual activity: Not on file   Other Topics Concern  . Not on file   Social History Narrative  . No narrative on file    Past Surgical Hx:  Past Surgical History:  Procedure Laterality Date  . BREAST BIOPSY  01/1996  . [place], 2003  . LAPAROSCOPIC HYSTERECTOMY  06/2010   BSO, repair of vaginal laceration, hernia repair  . MASTECTOMY  1999  . skin cancer removal  2005, 2007   basal cell and squamous cell    Past Medical Hx:  Past Medical History:  Diagnosis Date  . Breast cancer ([place]) 01/1996   with recurrence in 1999  . Endometrioid adenocarcinoma 06/2010   Stage IA, grade 2  . H/O cesarean section 05/18/1981  . Hypertension   . Obesity   . Radiation 1997   for breast ca  . Rapid heart rate     Oncology Hx:    Endometrial adenocarcinoma ([place])   06/25/2010 Initial Diagnosis    Endometrial adenocarcinoma, IA grade 2 no LVSI       Family Hx:  Family History  Problem Relation Age of Onset  . Adopted: Yes    Vitals:  Blood pressure (!) 166/78, pulse 66, temperature 97.8 F (36.6 C), temperature source Oral, resp. rate 18, height 5\' 3"  (1.6 m), weight 236 lb 6.4 oz (107.2 kg), SpO2 100 %.  Physical Exam: GENERAL: Well-nourished, well-developed female, in no acute distress.   NECK: Supple. There is no lymphadenopathy. No thyromegaly.   LUNGS: Clear to auscultation bilaterally.   CARDIOVASCULAR EXAM: Regular rate and rhythm.   ABDOMEN: Shows well-healed surgical incisions. Abdomen is soft, nontender, and nondistended. There are no palpable masses or hepatosplenomegaly, but exam is somewhat limited by habitus. ? Small umbilical hernia. Groins are negative for adenopathy.   EXTREMITIES: Shows 1 to 2+ nonpitting edema and equal bilaterally.   PELVIC: External genitalia is within normal  limits. The vagina is markedly atrophic. The vaginal cuff is visualized. There is no visible lesions. Pap smear submitted without difficulty.  BIMANUAL EXAMINATION: Reveals no masses or nodularity.   RECTAL: Confirms.   Assessment/Plan:  A 65 year old with a stage IA grade 2 endometrioid adenocarcinoma, status post laparoscopic hysterectomy, BSO, and hernia repair in September 2011, who clinically has no evidence of recurrent disease.  We will follow-up in results of her Pap smear from today. As she is more than 5 years out release her from our clinic and she will follow-up with Dr. Corinna Capra for routine GYN care.  She was encouraged not to use steroid cream on the vulva as there is no visible lesion and long-term steroids can cause thinning of the skin. She can use other lotions for symptomatic relief. There is no visible lesion.  She will follow-up with  her other physicians as scheduled. She knows that we'll be happy to see her in the future should the need arise.      Nikelle Malatesta A., MD 08/26/2016, 3:09 PM

## 2016-08-26 NOTE — Patient Instructions (Signed)
Plan to follow up with your other physicians including Dr. Reynaldo Minium and Dr. Corinna Capra.  Please call for any questions or concerns.  No need for follow up with Dr. Alycia Rossetti at this time.

## 2016-08-28 ENCOUNTER — Telehealth: Payer: Self-pay

## 2016-08-28 LAB — CYTOLOGY - PAP: Diagnosis: NEGATIVE

## 2016-08-28 NOTE — Telephone Encounter (Signed)
Orders received from [facility] to contact the patient with her results being "normal". Attempted to contact the patient , no answer, left a detailed message. Are contact information was provided if additional questions arise.

## 2016-09-03 DIAGNOSIS — Z6841 Body Mass Index (BMI) 40.0 and over, adult: Secondary | ICD-10-CM | POA: Diagnosis not present

## 2016-09-03 DIAGNOSIS — R7301 Impaired fasting glucose: Secondary | ICD-10-CM | POA: Diagnosis not present

## 2016-09-03 DIAGNOSIS — I10 Essential (primary) hypertension: Secondary | ICD-10-CM | POA: Diagnosis not present

## 2016-09-03 DIAGNOSIS — R269 Unspecified abnormalities of gait and mobility: Secondary | ICD-10-CM | POA: Diagnosis not present

## 2016-09-03 DIAGNOSIS — E785 Hyperlipidemia, unspecified: Secondary | ICD-10-CM | POA: Diagnosis not present

## 2016-11-03 DIAGNOSIS — J209 Acute bronchitis, unspecified: Secondary | ICD-10-CM | POA: Diagnosis not present

## 2016-11-03 DIAGNOSIS — Z139 Encounter for screening, unspecified: Secondary | ICD-10-CM | POA: Diagnosis not present

## 2016-11-10 DIAGNOSIS — Z1231 Encounter for screening mammogram for malignant neoplasm of breast: Secondary | ICD-10-CM | POA: Diagnosis not present

## 2016-11-18 DIAGNOSIS — H40023 Open angle with borderline findings, high risk, bilateral: Secondary | ICD-10-CM | POA: Diagnosis not present

## 2016-11-18 DIAGNOSIS — H2513 Age-related nuclear cataract, bilateral: Secondary | ICD-10-CM | POA: Diagnosis not present

## 2016-12-04 DIAGNOSIS — Z8589 Personal history of malignant neoplasm of other organs and systems: Secondary | ICD-10-CM | POA: Diagnosis not present

## 2016-12-04 DIAGNOSIS — C50912 Malignant neoplasm of unspecified site of left female breast: Secondary | ICD-10-CM | POA: Diagnosis not present

## 2016-12-04 DIAGNOSIS — Z85828 Personal history of other malignant neoplasm of skin: Secondary | ICD-10-CM | POA: Diagnosis not present

## 2017-01-05 DIAGNOSIS — I10 Essential (primary) hypertension: Secondary | ICD-10-CM | POA: Diagnosis not present

## 2017-01-05 DIAGNOSIS — R7301 Impaired fasting glucose: Secondary | ICD-10-CM | POA: Diagnosis not present

## 2017-01-05 DIAGNOSIS — E784 Other hyperlipidemia: Secondary | ICD-10-CM | POA: Diagnosis not present

## 2017-01-08 DIAGNOSIS — Z1212 Encounter for screening for malignant neoplasm of rectum: Secondary | ICD-10-CM | POA: Diagnosis not present

## 2017-01-12 DIAGNOSIS — E784 Other hyperlipidemia: Secondary | ICD-10-CM | POA: Diagnosis not present

## 2017-01-12 DIAGNOSIS — Z6841 Body Mass Index (BMI) 40.0 and over, adult: Secondary | ICD-10-CM | POA: Diagnosis not present

## 2017-01-12 DIAGNOSIS — Z0000 Encounter for general adult medical examination without abnormal findings: Secondary | ICD-10-CM | POA: Diagnosis not present

## 2017-01-12 DIAGNOSIS — R7301 Impaired fasting glucose: Secondary | ICD-10-CM | POA: Diagnosis not present

## 2017-01-12 DIAGNOSIS — I10 Essential (primary) hypertension: Secondary | ICD-10-CM | POA: Diagnosis not present

## 2017-01-12 DIAGNOSIS — Z23 Encounter for immunization: Secondary | ICD-10-CM | POA: Diagnosis not present

## 2017-01-12 DIAGNOSIS — R269 Unspecified abnormalities of gait and mobility: Secondary | ICD-10-CM | POA: Diagnosis not present

## 2017-02-16 DIAGNOSIS — Z1212 Encounter for screening for malignant neoplasm of rectum: Secondary | ICD-10-CM | POA: Diagnosis not present

## 2017-03-23 DIAGNOSIS — K219 Gastro-esophageal reflux disease without esophagitis: Secondary | ICD-10-CM | POA: Diagnosis not present

## 2017-03-23 DIAGNOSIS — Z139 Encounter for screening, unspecified: Secondary | ICD-10-CM | POA: Diagnosis not present

## 2017-03-23 DIAGNOSIS — Z6841 Body Mass Index (BMI) 40.0 and over, adult: Secondary | ICD-10-CM | POA: Diagnosis not present

## 2017-03-23 DIAGNOSIS — I10 Essential (primary) hypertension: Secondary | ICD-10-CM | POA: Diagnosis not present

## 2017-03-23 DIAGNOSIS — J209 Acute bronchitis, unspecified: Secondary | ICD-10-CM | POA: Diagnosis not present

## 2017-03-23 DIAGNOSIS — Z719 Counseling, unspecified: Secondary | ICD-10-CM | POA: Diagnosis not present

## 2017-04-30 DIAGNOSIS — Z6841 Body Mass Index (BMI) 40.0 and over, adult: Secondary | ICD-10-CM | POA: Diagnosis not present

## 2017-04-30 DIAGNOSIS — E669 Obesity, unspecified: Secondary | ICD-10-CM | POA: Diagnosis not present

## 2017-04-30 DIAGNOSIS — I10 Essential (primary) hypertension: Secondary | ICD-10-CM | POA: Diagnosis not present

## 2017-04-30 DIAGNOSIS — E119 Type 2 diabetes mellitus without complications: Secondary | ICD-10-CM | POA: Diagnosis not present

## 2017-04-30 DIAGNOSIS — E784 Other hyperlipidemia: Secondary | ICD-10-CM | POA: Diagnosis not present

## 2017-04-30 DIAGNOSIS — R269 Unspecified abnormalities of gait and mobility: Secondary | ICD-10-CM | POA: Diagnosis not present

## 2017-06-12 DIAGNOSIS — R011 Cardiac murmur, unspecified: Secondary | ICD-10-CM | POA: Diagnosis not present

## 2017-06-12 DIAGNOSIS — I10 Essential (primary) hypertension: Secondary | ICD-10-CM | POA: Diagnosis not present

## 2017-06-12 DIAGNOSIS — K219 Gastro-esophageal reflux disease without esophagitis: Secondary | ICD-10-CM | POA: Diagnosis not present

## 2017-06-12 DIAGNOSIS — R9431 Abnormal electrocardiogram [ECG] [EKG]: Secondary | ICD-10-CM | POA: Diagnosis not present

## 2017-06-12 DIAGNOSIS — E782 Mixed hyperlipidemia: Secondary | ICD-10-CM | POA: Diagnosis not present

## 2017-08-31 DIAGNOSIS — R269 Unspecified abnormalities of gait and mobility: Secondary | ICD-10-CM | POA: Diagnosis not present

## 2017-08-31 DIAGNOSIS — E7849 Other hyperlipidemia: Secondary | ICD-10-CM | POA: Diagnosis not present

## 2017-08-31 DIAGNOSIS — I10 Essential (primary) hypertension: Secondary | ICD-10-CM | POA: Diagnosis not present

## 2017-08-31 DIAGNOSIS — E119 Type 2 diabetes mellitus without complications: Secondary | ICD-10-CM | POA: Diagnosis not present

## 2017-08-31 DIAGNOSIS — Z6841 Body Mass Index (BMI) 40.0 and over, adult: Secondary | ICD-10-CM | POA: Diagnosis not present

## 2017-09-15 DIAGNOSIS — Z779 Other contact with and (suspected) exposures hazardous to health: Secondary | ICD-10-CM | POA: Diagnosis not present

## 2017-09-15 DIAGNOSIS — Z6841 Body Mass Index (BMI) 40.0 and over, adult: Secondary | ICD-10-CM | POA: Diagnosis not present

## 2017-09-15 DIAGNOSIS — Z124 Encounter for screening for malignant neoplasm of cervix: Secondary | ICD-10-CM | POA: Diagnosis not present

## 2017-10-27 DIAGNOSIS — Z1231 Encounter for screening mammogram for malignant neoplasm of breast: Secondary | ICD-10-CM | POA: Diagnosis not present

## 2017-10-29 DIAGNOSIS — N9089 Other specified noninflammatory disorders of vulva and perineum: Secondary | ICD-10-CM | POA: Diagnosis not present

## 2017-10-29 DIAGNOSIS — N904 Leukoplakia of vulva: Secondary | ICD-10-CM | POA: Diagnosis not present

## 2017-11-18 DIAGNOSIS — H40023 Open angle with borderline findings, high risk, bilateral: Secondary | ICD-10-CM | POA: Diagnosis not present

## 2017-11-18 DIAGNOSIS — H2513 Age-related nuclear cataract, bilateral: Secondary | ICD-10-CM | POA: Diagnosis not present

## 2017-12-17 DIAGNOSIS — Z8589 Personal history of malignant neoplasm of other organs and systems: Secondary | ICD-10-CM | POA: Diagnosis not present

## 2017-12-17 DIAGNOSIS — C50912 Malignant neoplasm of unspecified site of left female breast: Secondary | ICD-10-CM | POA: Diagnosis not present

## 2017-12-17 DIAGNOSIS — Z85828 Personal history of other malignant neoplasm of skin: Secondary | ICD-10-CM | POA: Diagnosis not present

## 2018-01-11 DIAGNOSIS — E119 Type 2 diabetes mellitus without complications: Secondary | ICD-10-CM | POA: Diagnosis not present

## 2018-01-11 DIAGNOSIS — I10 Essential (primary) hypertension: Secondary | ICD-10-CM | POA: Diagnosis not present

## 2018-01-11 DIAGNOSIS — R82998 Other abnormal findings in urine: Secondary | ICD-10-CM | POA: Diagnosis not present

## 2018-01-11 DIAGNOSIS — E7849 Other hyperlipidemia: Secondary | ICD-10-CM | POA: Diagnosis not present

## 2018-01-21 DIAGNOSIS — Z1212 Encounter for screening for malignant neoplasm of rectum: Secondary | ICD-10-CM | POA: Diagnosis not present

## 2018-01-21 DIAGNOSIS — Z1389 Encounter for screening for other disorder: Secondary | ICD-10-CM | POA: Diagnosis not present

## 2018-01-21 DIAGNOSIS — Z0000 Encounter for general adult medical examination without abnormal findings: Secondary | ICD-10-CM | POA: Diagnosis not present

## 2018-01-21 DIAGNOSIS — E1169 Type 2 diabetes mellitus with other specified complication: Secondary | ICD-10-CM | POA: Diagnosis not present

## 2018-01-21 DIAGNOSIS — R269 Unspecified abnormalities of gait and mobility: Secondary | ICD-10-CM | POA: Diagnosis not present

## 2018-01-21 DIAGNOSIS — I10 Essential (primary) hypertension: Secondary | ICD-10-CM | POA: Diagnosis not present

## 2018-01-21 DIAGNOSIS — E7849 Other hyperlipidemia: Secondary | ICD-10-CM | POA: Diagnosis not present

## 2018-02-16 DIAGNOSIS — Z6841 Body Mass Index (BMI) 40.0 and over, adult: Secondary | ICD-10-CM | POA: Diagnosis not present

## 2018-02-16 DIAGNOSIS — I10 Essential (primary) hypertension: Secondary | ICD-10-CM | POA: Diagnosis not present

## 2018-02-16 DIAGNOSIS — H6123 Impacted cerumen, bilateral: Secondary | ICD-10-CM | POA: Diagnosis not present

## 2018-02-16 DIAGNOSIS — J069 Acute upper respiratory infection, unspecified: Secondary | ICD-10-CM | POA: Diagnosis not present

## 2018-02-16 DIAGNOSIS — R05 Cough: Secondary | ICD-10-CM | POA: Diagnosis not present

## 2018-03-22 DIAGNOSIS — K219 Gastro-esophageal reflux disease without esophagitis: Secondary | ICD-10-CM | POA: Diagnosis not present

## 2018-03-22 DIAGNOSIS — Z6841 Body Mass Index (BMI) 40.0 and over, adult: Secondary | ICD-10-CM | POA: Diagnosis not present

## 2018-03-22 DIAGNOSIS — I10 Essential (primary) hypertension: Secondary | ICD-10-CM | POA: Diagnosis not present

## 2018-03-22 DIAGNOSIS — R109 Unspecified abdominal pain: Secondary | ICD-10-CM | POA: Diagnosis not present

## 2018-03-22 DIAGNOSIS — R197 Diarrhea, unspecified: Secondary | ICD-10-CM | POA: Diagnosis not present

## 2018-03-22 DIAGNOSIS — K5792 Diverticulitis of intestine, part unspecified, without perforation or abscess without bleeding: Secondary | ICD-10-CM | POA: Diagnosis not present

## 2018-03-22 HISTORY — DX: Diverticulitis of intestine, part unspecified, without perforation or abscess without bleeding: K57.92

## 2018-03-23 ENCOUNTER — Encounter: Payer: Self-pay | Admitting: Gastroenterology

## 2018-05-20 DIAGNOSIS — R269 Unspecified abnormalities of gait and mobility: Secondary | ICD-10-CM | POA: Diagnosis not present

## 2018-05-20 DIAGNOSIS — E1169 Type 2 diabetes mellitus with other specified complication: Secondary | ICD-10-CM | POA: Diagnosis not present

## 2018-05-20 DIAGNOSIS — Z8719 Personal history of other diseases of the digestive system: Secondary | ICD-10-CM | POA: Diagnosis not present

## 2018-05-20 DIAGNOSIS — E7849 Other hyperlipidemia: Secondary | ICD-10-CM | POA: Diagnosis not present

## 2018-05-20 DIAGNOSIS — I10 Essential (primary) hypertension: Secondary | ICD-10-CM | POA: Diagnosis not present

## 2018-05-20 DIAGNOSIS — Z6841 Body Mass Index (BMI) 40.0 and over, adult: Secondary | ICD-10-CM | POA: Diagnosis not present

## 2018-05-20 DIAGNOSIS — K219 Gastro-esophageal reflux disease without esophagitis: Secondary | ICD-10-CM | POA: Diagnosis not present

## 2018-05-24 ENCOUNTER — Ambulatory Visit (AMBULATORY_SURGERY_CENTER): Payer: Self-pay | Admitting: *Deleted

## 2018-05-24 VITALS — Ht 63.0 in | Wt 240.0 lb

## 2018-05-24 DIAGNOSIS — Z1211 Encounter for screening for malignant neoplasm of colon: Secondary | ICD-10-CM

## 2018-05-24 NOTE — Progress Notes (Signed)
Pt came in today for PV, husband at side. Pt anxious that she has something wrong with her heart, she states she does not feel right and wants to see her PCP and have a EKG before the colonoscopy is done. Pt states she is to see Dr.Miller heart dr in [place] on Sept 2. Pt wants to cancel the colonoscopy until after she sees her PCP and heart MD. I made new appointment for PV and colon with Dr.Nandigam and cancelled her current colon appointment. Encouraged pt to bring in copies of any tests she has done by her dr's. Pt is aware.

## 2018-06-03 ENCOUNTER — Encounter: Payer: Self-pay | Admitting: Internal Medicine

## 2018-06-07 ENCOUNTER — Encounter: Payer: Medicare Other | Admitting: Gastroenterology

## 2018-06-25 ENCOUNTER — Encounter: Payer: Self-pay | Admitting: Internal Medicine

## 2018-06-25 ENCOUNTER — Ambulatory Visit (INDEPENDENT_AMBULATORY_CARE_PROVIDER_SITE_OTHER): Payer: Medicare Other | Admitting: Internal Medicine

## 2018-06-25 VITALS — BP 148/84 | HR 80 | Ht 63.0 in | Wt 238.2 lb

## 2018-06-25 DIAGNOSIS — I10 Essential (primary) hypertension: Secondary | ICD-10-CM

## 2018-06-25 DIAGNOSIS — M25561 Pain in right knee: Secondary | ICD-10-CM

## 2018-06-25 DIAGNOSIS — E785 Hyperlipidemia, unspecified: Secondary | ICD-10-CM | POA: Diagnosis not present

## 2018-06-25 DIAGNOSIS — Z01810 Encounter for preprocedural cardiovascular examination: Secondary | ICD-10-CM

## 2018-06-25 DIAGNOSIS — M25562 Pain in left knee: Secondary | ICD-10-CM | POA: Diagnosis not present

## 2018-06-25 DIAGNOSIS — G8929 Other chronic pain: Secondary | ICD-10-CM

## 2018-06-25 MED ORDER — HYDROCHLOROTHIAZIDE 12.5 MG PO CAPS: 12.5000 mg | ORAL_CAPSULE | Freq: Every day | ORAL | 3 refills | Status: DC

## 2018-06-25 MED ORDER — AMLODIPINE BESYLATE 2.5 MG PO TABS: 2.5000 mg | ORAL_TABLET | Freq: Every day | ORAL | 3 refills | Status: DC

## 2018-06-25 MED ORDER — ROSUVASTATIN CALCIUM 5 MG PO TABS: 5.0000 mg | ORAL_TABLET | Freq: Every day | ORAL | 3 refills | Status: AC

## 2018-06-25 NOTE — Patient Instructions (Addendum)
Medication Instructions:  Your physician has recommended you make the following change in your medication:   1.) start rosuvastatin (Crestor) 5 mg once a day for cholesterol 2.) start amlodipine (Norvasc) 2.5 mg once a day for blood pressure   Labwork: Your physician recommends that you return for lab work in: 10 weeks (lipids, as)   Testing/Procedures: none  Follow-Up: Your physician recommends that you schedule a follow-up appointment in: 6 weeks with Dr. Harrington Challenger.   Any Other Special Instructions Will Be Listed Below (If Applicable).  You have been referred to Sports Medicine, Dr. Gardenia Phlegm.    If you need a refill on your cardiac medications before your next appointment, please call your pharmacy.

## 2018-06-25 NOTE — Progress Notes (Signed)
Cardiology Office Note   Date:  06/25/2018   ID:  Paige Mccann, Paige Mccann [birth year], MRN [id number]  PCP:  Burnard Bunting, MD  Cardiologist:   Dorris Carnes, MD   Pt presetns for preop eval for colonosopy      History of Present Illness: Paige Mccann is a 67 y.o. female with a history of DM, HTN, obesity  No history of CAD   She is adopted  Followed by Dr Sabra Heck in [place] for colonoscopy  Wanted a preop stratification   Follows with  Dr Yetta Numbers  A1C 6.9   NOt on any meds  Last LDL was 120    Pt has been treated for HTN for years  BP at home a little higher     Pt does not exercise much  Has arthritis  Broke R leg in past    Meniscus problems     Has palpitaitons    On and off since 1980s     No dizzy  Short lived     Pt does snore   NOw  Constriction sound to it   Husband does not think she hs apnea  Current Meds  Medication Sig  . aspirin EC 81 MG tablet Take 81 mg by mouth as directed. Monday, Wednesday, and Friday  . enalapril (VASOTEC) 20 MG tablet Take 20 mg by mouth daily.  . hydrochlorothiazide (MICROZIDE) 12.5 MG capsule Take 12.5 mg by mouth daily.  . metoprolol tartrate (LOPRESSOR) 25 MG tablet Take 25 mg by mouth 2 (two) times daily.  . Multiple Vitamins-Minerals (AIRBORNE) CHEW Chew 1 tablet by mouth as needed.  Marland Kitchen omeprazole (PRILOSEC OTC) 20 MG tablet Take 20 mg by mouth daily.  Marland Kitchen OVER THE COUNTER MEDICATION Glucosamine MSM complex 1 tablet 2 times daily  . Probiotic Product (RA PROBIOTIC GUMMIES PO) Take 1 Dose by mouth daily.  . [DISCONTINUED] hydrochlorothiazide (HYDRODIURIL) 25 MG tablet Take 12.5 mg by mouth daily.     Allergies:   Cefdinir; Ciprofloxacin; Codeine; Neosporin [neomycin-bacitracin zn-polymyx]; and Prednisone   Past Medical History:  Diagnosis Date  . Breast cancer ([place]) 01/1996   with recurrence in 1999  . Diverticulitis 03/22/2018   treatment done with [place]  . Endometrioid adenocarcinoma 06/2010   Stage IA, grade  2  . GERD (gastroesophageal reflux disease)   . H/O cesarean section 05/18/1981  . Hyperlipidemia   . Hypertension   . Obesity   . Radiation 1997   for breast ca  . Rapid heart rate     Past Surgical History:  Procedure Laterality Date  . BREAST BIOPSY  01/1996  . [place]  . [place]ONOSCOPY  [place]   [place] exam  . DILATION AND CURETTAGE OF UTERUS  1993, 2003  . LAPAROSCOPIC HYSTERECTOMY  06/2010   BSO, repair of vaginal laceration, hernia repair  . MASTECTOMY  1999  . skin cancer removal  2005, 2007   basal cell and squamous cell     Social History:  The patient  reports that she has never smoked. She has never used smokeless tobacco. She reports that she drinks alcohol. She reports that she does not use drugs.   Family History:  The patient's family history is not on file. She was adopted.    ROS:  Please see the history of present illness. All other systems are reviewed and  Negative to the above problem except as noted.    PHYSICAL EXAM: VS:  BP Marland Kitchen)  148/84   Pulse 80   Ht 5\' 3"  (1.6 m)   Wt 238 lb 3.2 oz (108 kg)   BMI 42.20 kg/m   GEN: Morbidly obese 67 yo  in no acute distress  HEENT: normal  Neck: no JVD, carotid bruits, or masses Cardiac: RRR; no murmurs, rubs, or gallops,no edema  Respiratory:  clear to auscultation bilaterally, normal work of breathing GI: soft, nontender, nondistended, + BS  No hepatomegaly  MS: no deformity Moving all extremities   Skin: warm and dry, no rash Neuro:  Strength and sensation are intact Psych: euthymic mood, full affect   EKG:  EKG is ordered today. SR 80 bpm    Lipid Panel No results found for: CHOL, TRIG, HDL, CHOLHDL, VLDL, LDLCALC, LDLDIRECT    Wt Readings from Last 3 Encounters:  06/25/18 238 lb 3.2 oz (108 kg)  05/24/18 240 lb (108.9 kg)  08/26/16 236 lb 6.4 oz (107.2 kg)      ASSESSMENT AND PLAN:  1  Preop   Pt without symptoms of angina   Active over 4 METS without  symtpmos     Overall I think she is at low risk for cardiac event   OK to proceed  2  HTN  BP is not optimally controlled   Will add amlodipine to regimen   F/U in 6 wks  Bring cuff  3  HL   Needs to get LDL down   Pt with metabolic syndrome/DM    Start 5 Crestor daily   F/U in 10 wks  4   Morbid obesity  Needs to lose wt   Discussed diet  Will refer to Z smith to evla   Help with exercise plan with jt issues  F/U in 6 wks       Current medicines are reviewed at length with the patient today.  The patient does not have concerns regarding medicines.  Signed, Dorris Carnes, MD  06/25/2018 9:02 AM    [place] [address], [place], [place]  [address] Phone: [phone]; Fax: [phone]

## 2018-07-02 ENCOUNTER — Telehealth: Payer: Self-pay | Admitting: Internal Medicine

## 2018-07-02 NOTE — Telephone Encounter (Signed)
Monday took amlodipine. Felt tightness in chest and head felt funny and bp was roughly the same 159/85, HR 77.  Lasted 1/2 a day. Did not take again and has never had those symptoms before.     Only had 1 single dose of amlodipine.  170/75 overnight one night  Thinks was having a panic attack in her sleep.  132/88 2 days ago.  Doesn't recall trying any other blood pressure medications in the past. Patient and husband (both on the phone) are aware that I will forward to Dr. Harrington Challenger and will call her back when she provides recommendations.

## 2018-07-02 NOTE — Telephone Encounter (Signed)
New Message:     Pt c/o medication issue:  1. Name of Medication: amLODipine (NORVASC) 2.5 MG tablet  2. How are you currently taking this medication (dosage and times per day)? Take 1 tablet (2.5 mg total) by mouth daily.  3. Are you having a reaction (difficulty breathing--STAT)? No   4. What is your medication issue? BP is high

## 2018-07-04 NOTE — Telephone Encounter (Signed)
Patient is on HCTZ I would stop amlodipine Stop HCTZ Start maxzide 37.5/25   Follow BP

## 2018-07-05 MED ORDER — TRIAMTERENE-HCTZ 37.5-25 MG PO TABS: 0.5000 | ORAL_TABLET | Freq: Every day | ORAL | 6 refills | Status: DC

## 2018-07-05 NOTE — Telephone Encounter (Signed)
Follow up   Patient is returning call in reference to Dr. Harrington Challenger recommendation concerning the amlodipine. Please call to discuss.     Pt c/o medication issue:  1. Name of Medication: amLODipine (NORVASC) 2.5 MG tablet  2. How are you currently taking this medication (dosage and times per day)? Take 1 tablet (2.5 mg total) by mouth daily.  3. Are you having a reaction (difficulty breathing--STAT)? No   4. What is your medication issue? BP is high

## 2018-07-05 NOTE — Telephone Encounter (Signed)
Spoke with patient. Informed to stop amlodipine (pt only took one dose last Monday) Informed to stop hctz Informed to start maxzide 1/2 of 37.5/25, clarified dose with Dr. Harrington Challenger.  Pt verbalizes understanding and will have husband or son monitor BP.

## 2018-07-13 ENCOUNTER — Ambulatory Visit (AMBULATORY_SURGERY_CENTER): Payer: Self-pay | Admitting: *Deleted

## 2018-07-13 VITALS — Ht 63.0 in | Wt 235.0 lb

## 2018-07-13 DIAGNOSIS — Z1211 Encounter for screening for malignant neoplasm of colon: Secondary | ICD-10-CM

## 2018-07-13 MED ORDER — NA SULFATE-K SULFATE-MG SULF 17.5-3.13-1.6 GM/177ML PO SOLN: 1.0000 | Freq: Once | ORAL | 0 refills | Status: AC

## 2018-07-13 NOTE — Progress Notes (Signed)
No egg or soy allergy known to patient  No issues with past sedation with any surgeries  or procedures, no intubation problems  No diet pills per patient No home 02 use per patient  No blood thinners per patient  Pt denies issues with constipation  No A fib or A flutter  EMMI video sent to pt's e mail  Pt declined Husband in PV with pt today

## 2018-07-27 ENCOUNTER — Ambulatory Visit (AMBULATORY_SURGERY_CENTER): Payer: Medicare Other | Admitting: Gastroenterology

## 2018-07-27 ENCOUNTER — Encounter: Payer: Self-pay | Admitting: Gastroenterology

## 2018-07-27 VITALS — BP 130/64 | HR 88 | Temp 97.8°F | Resp 10 | Ht 63.0 in | Wt 238.0 lb

## 2018-07-27 DIAGNOSIS — Z1211 Encounter for screening for malignant neoplasm of colon: Secondary | ICD-10-CM | POA: Diagnosis not present

## 2018-07-27 DIAGNOSIS — D123 Benign neoplasm of transverse colon: Secondary | ICD-10-CM

## 2018-07-27 MED ORDER — SODIUM CHLORIDE 0.9 % IV SOLN: 500.0000 mL | Freq: Once | INTRAVENOUS | Status: AC

## 2018-07-27 MED ORDER — HYDROCORTISONE ACE-PRAMOXINE 1-1 % RE CREA: 1.0000 "application " | TOPICAL_CREAM | Freq: Two times a day (BID) | RECTAL | 1 refills | Status: DC

## 2018-07-27 NOTE — Progress Notes (Signed)
Called to room to assist during endoscopic procedure.  Patient ID and intended procedure confirmed with present staff. Received instructions for my participation in the procedure from the performing physician.  

## 2018-07-27 NOTE — Op Note (Signed)
[facility] [place] Patient Name: Paige Mccann Procedure Date: 07/27/2018 10:59 AM MRN: [id number] Endoscopist: Mauri Pole , MD Age: 67 Referring MD:  Date of Birth: [birth year] Gender: Female Account #: [id number] Procedure:                Colonoscopy Indications:              Screening for colorectal malignant neoplasm Medicines:                Monitored Anesthesia Care Procedure:                Pre-Anesthesia Assessment:                           - Prior to the procedure, a History and Physical                            was performed, and patient medications and                            allergies were reviewed. The patient's tolerance of                            previous anesthesia was also reviewed. The risks                            and benefits of the procedure and the sedation                            options and risks were discussed with the patient.                            All questions were answered, and informed consent                            was obtained. Prior Anticoagulants: The patient has                            taken no previous anticoagulant or antiplatelet                            agents. ASA Grade Assessment: III - A patient with                            severe systemic disease. After reviewing the risks                            and benefits, the patient was deemed in                            satisfactory condition to undergo the procedure.                           After obtaining informed consent, the colonoscope  was passed under direct vision. Throughout the                            procedure, the patient's blood pressure, pulse, and                            oxygen saturations were monitored continuously. The                            Colonoscope was introduced through the anus and                            advanced to the the cecum, identified by                            appendiceal  orifice and ileocecal valve. The                            colonoscopy was performed without difficulty. The                            patient tolerated the procedure well. The quality                            of the bowel preparation was excellent. The                            ileocecal valve, appendiceal orifice, and rectum                            were photographed. Scope In: 11:14:00 AM Scope Out: 11:35:30 AM Scope Withdrawal Time: 0 hours 12 minutes 37 seconds  Total Procedure Duration: 0 hours 21 minutes 30 seconds  Findings:                 The perianal and digital rectal examinations were                            normal.                           A 2 mm polyp was found in the transverse colon. The                            polyp was sessile. The polyp was removed with a                            cold biopsy forceps. Resection and retrieval were                            complete.                           A 4 mm polyp was found in the transverse colon. The  polyp was sessile. The polyp was removed with a                            cold snare. Resection and retrieval were complete.                           A 11 mm polyp was found in the transverse colon.                            The polyp was pedunculated. The polyp was removed                            with a hot snare. Resection and retrieval were                            complete.                           Scattered small and large-mouthed diverticula were                            found in the sigmoid colon, descending colon,                            transverse colon and ascending colon.                           Non-bleeding internal hemorrhoids were found during                            retroflexion. The hemorrhoids were small. Complications:            No immediate complications. Estimated Blood Loss:     Estimated blood loss was minimal. Impression:               - One 2  mm polyp in the transverse colon, removed                            with a cold biopsy forceps. Resected and retrieved.                           - One 4 mm polyp in the transverse colon, removed                            with a cold snare. Resected and retrieved.                           - One 11 mm polyp in the transverse colon, removed                            with a hot snare. Resected and retrieved.                           - Moderate diverticulosis in the sigmoid colon,  in                            the descending colon, in the transverse colon and                            in the ascending colon.                           - Non-bleeding internal hemorrhoids. Recommendation:           - Patient has a contact number available for                            emergencies. The signs and symptoms of potential                            delayed complications were discussed with the                            patient. Return to normal activities tomorrow.                            Written discharge instructions were provided to the                            patient.                           - Resume previous diet.                           - Continue present medications.                           - Await pathology results.                           - Repeat colonoscopy in 3 - 5 years for                            surveillance based on pathology results. Mauri Pole, MD 07/27/2018 11:47:35 AM This report has been signed electronically.

## 2018-07-27 NOTE — Progress Notes (Signed)
Pt's states no medical or surgical changes since previsit or office visit. 

## 2018-07-27 NOTE — Progress Notes (Signed)
To PACU, VSS. Report to [email or web address].tb 

## 2018-07-27 NOTE — Patient Instructions (Signed)
YOU HAD AN ENDOSCOPIC PROCEDURE TODAY AT THE [facility]:   Refer to the procedure report that was given to you for any specific questions about what was found during the examination.  If the procedure report does not answer your questions, please call your gastroenterologist to clarify.  If you requested that your care partner not be given the details of your procedure findings, then the procedure report has been included in a sealed envelope for you to review at your convenience later.  YOU SHOULD EXPECT: Some feelings of bloating in the abdomen. Passage of more gas than usual.  Walking can help get rid of the air that was put into your GI tract during the procedure and reduce the bloating. If you had a lower endoscopy (such as a colonoscopy or flexible sigmoidoscopy) you may notice spotting of blood in your stool or on the toilet paper. If you underwent a bowel prep for your procedure, you may not have a normal bowel movement for a few days.  Please Note:  You might notice some irritation and congestion in your nose or some drainage.  This is from the oxygen used during your procedure.  There is no need for concern and it should clear up in a day or so.  SYMPTOMS TO REPORT IMMEDIATELY:   Following lower endoscopy (colonoscopy or flexible sigmoidoscopy):  Excessive amounts of blood in the stool  Significant tenderness or worsening of abdominal pains  Swelling of the abdomen that is new, acute  Fever of 100F or higher   For urgent or emergent issues, a gastroenterologist can be reached at any hour by calling [phone].   DIET:  We do recommend a small meal at first, but then you may proceed to your regular diet.  Drink plenty of fluids but you should avoid alcoholic beverages for 24 hours. Try to increase the fiber in your diet, and drink plenty of water.  ACTIVITY:  You should plan to take it easy for the rest of today and you should NOT DRIVE or use heavy machinery until  tomorrow (because of the sedation medicines used during the test).    FOLLOW UP: Our staff will call the number listed on your records the next business day following your procedure to check on you and address any questions or concerns that you may have regarding the information given to you following your procedure. If we do not reach you, we will leave a message.  However, if you are feeling well and you are not experiencing any problems, there is no need to return our call.  We will assume that you have returned to your regular daily activities without incident.  If any biopsies were taken you will be contacted by phone or by letter within the next 1-3 weeks.  Please call us at [phone] if you have not heard about the biopsies in 3 weeks.    SIGNATURES/CONFIDENTIALITY: You and/or your care partner have signed paperwork which will be entered into your electronic medical record.  These signatures attest to the fact that that the information above on your After Visit Summary has been reviewed and is understood.  Full responsibility of the confidentiality of this discharge information lies with you and/or your care-partner.  Read all handouts given to you by your recovery room nurse. 

## 2018-07-27 NOTE — Addendum Note (Signed)
Addended by: Mauri Pole on: 07/27/2018 02:06 PM   Modules accepted: Orders

## 2018-07-28 ENCOUNTER — Telehealth: Payer: Self-pay | Admitting: *Deleted

## 2018-07-28 NOTE — Telephone Encounter (Signed)
  Follow up Call-  Call back number 07/27/2018  Post procedure Call Back phone  # [phone]  Permission to leave phone message Yes  Some recent data might be hidden     Patient questions:  Do you have a fever, pain , or abdominal swelling? No. Pain Score  0 *  Have you tolerated food without any problems? Yes.    Have you been able to return to your normal activities? Yes.    Do you have any questions about your discharge instructions: Diet   No. Medications  No. Follow up visit  No.  Do you have questions or concerns about your Care? No.  Actions: * If pain score is 4 or above: No action needed, pain <4.

## 2018-07-31 NOTE — Progress Notes (Signed)
[name] [place], [place] [address] Phone: [phone] Subjective:    I Kandace Blitz am serving as a Education administrator for Dr. Hulan Saas.  CC: Bilateral knee pain  YQI:[id number]  Paige Mccann is a 67 y.o. female coming in with complaint of bilateral knee pain. Left knee has multiple tears in meniscus over 42 years old. Has broken right leg in 2016. Wants to know which exercises that won't cause more damage to her knees.  Patient did have the mild tibial peroneal toe fracture that healed conservatively on the right side and patient did have a meniscal tear on the left side.  Patient states that continuing to have pain though.  Has done physical therapy but felt like it seemed to exacerbate it.  Patient wants to be active and try to lose weight but finds it very difficult with her having increasing pain.  Has noticed that she has had to use the cane on a much more regular basis.  Rates the severity of pain sometimes is 8 out of 10 especially with worsening pain.     Past Medical History:  Diagnosis Date  . Allergy    mild  . Arthritis   . Breast cancer ([place]) 01/1996   with recurrence in 1999- left side   . Cataract    forming  . Diverticulitis 03/22/2018   treatment done with Aumentin  . Endometrioid adenocarcinoma 06/2010   Stage IA, grade 2  . GERD (gastroesophageal reflux disease)   . H/O cesarean section 05/18/1981  . Heart murmur    past hx   . Hyperlipidemia   . Hypertension   . Obesity   . Radiation 1997   for breast ca  . Rapid heart rate   . Skin cancer    basal and squamous cell   . Tibia fracture    Past Surgical History:  Procedure Laterality Date  . BREAST BIOPSY  01/1996  . [place]  . COLONOSCOPY  [place]   [place] exam  . DILATION AND CURETTAGE OF UTERUS  1993, 2003  . LAPAROSCOPIC HYSTERECTOMY  06/2010   BSO, repair of vaginal laceration, hernia repair  . MASTECTOMY  1999  .  skin cancer removal  2005, 2007   basal cell and squamous cell   Social History   Socioeconomic History  . Marital status: Married    Spouse name: Not on file  . Number of children: 1  . Years of education: COLLEGE  . Highest education level: Not on file  Occupational History  . Not on file  Social Needs  . Financial resource strain: Not on file  . Food insecurity:    Worry: Not on file    Inability: Not on file  . Transportation needs:    Medical: Not on file    Non-medical: Not on file  Tobacco Use  . Smoking status: Never Smoker  . Smokeless tobacco: Never Used  Substance and Sexual Activity  . Alcohol use: Yes    Comment: onec or twice a year  . Drug use: No  . Sexual activity: Not on file  Lifestyle  . Physical activity:    Days per week: Not on file    Minutes per session: Not on file  . Stress: Not on file  Relationships  . Social connections:    Talks on phone: Not on file    Gets together: Not on file    Attends religious service: Not on  file    Active member of club or organization: Not on file    Attends meetings of clubs or organizations: Not on file    Relationship status: Not on file  Other Topics Concern  . Not on file  Social History Narrative  . Not on file   Allergies  Allergen Reactions  . Cefdinir Other (See Comments)    weakness  . Ciprofloxacin     Dizziness  . Codeine     "feel weird"  . Neosporin [Neomycin-Bacitracin Zn-Polymyx] Itching    redness  . Prednisone     "feel wired"   Family History  Adopted: Yes      Current Outpatient Medications (Cardiovascular):  .  enalapril (VASOTEC) 20 MG tablet, Take 20 mg by mouth daily. .  metoprolol tartrate (LOPRESSOR) 25 MG tablet, Take 25 mg by mouth 2 (two) times daily. .  rosuvastatin (CRESTOR) 5 MG tablet, Take 1 tablet (5 mg total) by mouth daily. Marland Kitchen  triamterene-hydrochlorothiazide (MAXZIDE-25) 37.5-25 MG tablet, Take 0.5 tablets by mouth daily.     Current Outpatient  Medications (Analgesics):  .  aspirin EC 81 MG tablet, Take 81 mg by mouth as directed. Monday, Wednesday, and Friday     Current Outpatient Medications (Other):  Marland Kitchen  Multiple Vitamins-Minerals (AIRBORNE) CHEW, Chew 1 tablet by mouth as needed. Marland Kitchen  omeprazole (PRILOSEC OTC) 20 MG tablet, Take 20 mg by mouth daily. Marland Kitchen  OVER THE COUNTER MEDICATION, Glucosamine MSM complex 1 tablet 2 times daily .  pramoxine-hydrocortisone (PROCTOCREAM-HC) 1-1 % rectal cream, Place 1 application rectally 2 (two) times daily. .  Probiotic Product (RA PROBIOTIC GUMMIES PO), Take 1 Dose by mouth daily.  Current Facility-Administered Medications (Other):  .  0.9 %  sodium chloride infusion    Past medical history, social, surgical and family history all reviewed in electronic medical record.  No pertanent information unless stated regarding to the chief complaint.   Review of Systems:  No headache, visual changes, nausea, vomiting, diarrhea, constipation, dizziness, abdominal pain, skin rash, fevers, chills, night sweats, weight loss, swollen lymph nodes, body aches, joint swelling, chest pain, shortness of breath, mood changes.  Positive muscle aches  Objective  Blood pressure 130/82, pulse 71, height 5\' 3"  (1.6 m), weight 239 lb (108.4 kg), SpO2 98 %.    General: No apparent distress alert and oriented x3 mood and affect normal, dressed appropriately.  Obesity HEENT: Pupils equal, extraocular movements intact  Respiratory: Patient's speak in full sentences and does not appear short of breath  Cardiovascular: Trace lower extremity edema, non tender, no erythema  Skin: Warm dry intact with no signs of infection or rash on extremities or on axial skeleton.  Abdomen: Soft nontender  Neuro: Cranial nerves II through XII are intact, neurovascularly intact in all extremities with 2+ DTRs and 2+ pulses.  Lymph: No lymphadenopathy of posterior or anterior cervical chain or axillae bilaterally.  Gait antalgic using  the aid of a cane MSK:  tender with full range of motion and good stability and symmetric strength and tone of shoulders, elbows, wrist, hip and ankles bilaterally.  Knee: Bilateral valgus deformity noted.  Abnormal thigh to calf ratio.  Tender to palpation over medial and PF joint line.  ROM full in flexion and extension and lower leg rotation mild tenderness noted anteriorly with full flexion bilaterally. instability with valgus force.  painful patellar compression. Patellar glide with moderate crepitus. Patellar and quadriceps tendons unremarkable. Hamstring and quadriceps strength is normal.  Impression and Recommendations:     This case required medical decision making of moderate complexity. The above documentation has been reviewed and is accurate and complete Lyndal Pulley, DO       Note: This dictation was prepared with Dragon dictation along with smaller phrase technology. Any transcriptional errors that result from this process are unintentional.

## 2018-08-02 ENCOUNTER — Ambulatory Visit (INDEPENDENT_AMBULATORY_CARE_PROVIDER_SITE_OTHER): Payer: Medicare Other | Admitting: Family Medicine

## 2018-08-02 ENCOUNTER — Encounter: Payer: Self-pay | Admitting: Family Medicine

## 2018-08-02 ENCOUNTER — Ambulatory Visit (INDEPENDENT_AMBULATORY_CARE_PROVIDER_SITE_OTHER)
Admission: RE | Admit: 2018-08-02 | Discharge: 2018-08-02 | Disposition: A | Discharge status: Home / Self Care | Payer: Medicare Other | Source: Ambulatory Visit | Attending: Family Medicine | Admitting: Family Medicine

## 2018-08-02 VITALS — BP 130/82 | HR 71 | Ht 63.0 in | Wt 239.0 lb

## 2018-08-02 DIAGNOSIS — M25562 Pain in left knee: Secondary | ICD-10-CM

## 2018-08-02 DIAGNOSIS — G8929 Other chronic pain: Secondary | ICD-10-CM

## 2018-08-02 DIAGNOSIS — M25561 Pain in right knee: Secondary | ICD-10-CM

## 2018-08-02 DIAGNOSIS — M170 Bilateral primary osteoarthritis of knee: Secondary | ICD-10-CM | POA: Diagnosis not present

## 2018-08-02 NOTE — Patient Instructions (Addendum)
Good to see you  Ice is your friend Ice 20 minutes 2 times daily. Usually after activity and before bed. Exercises 3 times a week.  Good shoes with rigid bottom.  Jalene Mullet, [place] or New balance greater then 700 pennsaid pinkie amount topically 2 times daily as needed.  Over the counter get  Vitamin D 2000 IU daily  Turmeric 500mg  daily  Tart cherry extract any dose  See me again in 6 weeks and we will see you how you are doing

## 2018-08-02 NOTE — Assessment & Plan Note (Signed)
Patient does have moderate to severe arthritic changes.  X-rays pending.  We discussed icing regimen and home exercise.  Topical anti-inflammatories given.  Work with Product/process development scientist to learn exercises in greater detail.  We discussed over-the-counter medications that could also be beneficial.  We discussed icing regimen.  Follow-up again in 4 to 8 weeks

## 2018-08-09 ENCOUNTER — Encounter: Payer: Self-pay | Admitting: Gastroenterology

## 2018-08-09 ENCOUNTER — Encounter: Payer: Self-pay | Admitting: Internal Medicine

## 2018-08-09 ENCOUNTER — Ambulatory Visit (INDEPENDENT_AMBULATORY_CARE_PROVIDER_SITE_OTHER): Payer: Medicare Other | Admitting: Internal Medicine

## 2018-08-09 VITALS — BP 142/78 | HR 78 | Ht 63.0 in | Wt 237.8 lb

## 2018-08-09 DIAGNOSIS — E785 Hyperlipidemia, unspecified: Secondary | ICD-10-CM

## 2018-08-09 DIAGNOSIS — I10 Essential (primary) hypertension: Secondary | ICD-10-CM | POA: Diagnosis not present

## 2018-08-09 LAB — BASIC METABOLIC PANEL
BUN / CREAT RATIO: 18 (ref 12–28)
BUN: 14 mg/dL (ref 8–27)
CHLORIDE: 97 mmol/L (ref 96–106)
CO2: 24 mmol/L (ref 20–29)
Calcium: 10 mg/dL (ref 8.7–10.3)
Creatinine, Ser: 0.79 mg/dL (ref 0.57–1.00)
GFR calc non Af Amer: 78 mL/min/{1.73_m2} (ref >59)
GFR, EST AFRICAN AMERICAN: 90 mL/min/{1.73_m2} (ref >59)
GLUCOSE: 151 mg/dL — AB (ref 65–99)
Potassium: 4.7 mmol/L (ref 3.5–5.2)
Sodium: 137 mmol/L (ref 134–144)

## 2018-08-09 NOTE — Progress Notes (Signed)
Cardiology Office Note   Date:  08/09/2018   ID:  [name], Allende [birth year], MRN [id number]  PCP:  Burnard Bunting, MD  Cardiologist:   Dorris Carnes, MD   Pt presents for F/U of HTN      History of Present Illness: Paige Mccann is a 67 y.o. female with a history of DM, HTN, obesity  No history of CAD   She is adopted  Followed by Dr Sabra Heck in [place]   I saw her back in September for preop evaluation   Felt OK to proceed  At that visit her BP was not optimally controlled  I recomm adding amlodipine to regimen    Since I saw her she tried amloodipine an could not take   Felt bad Now on Maxzide 1/2 of  37.5/25  Current Meds  Medication Sig  . aspirin EC 81 MG tablet Take 81 mg by mouth as directed. Monday, Wednesday, and Friday  . Cholecalciferol (VITAMIN D3) 1000 units CAPS Take 1,000 Units by mouth daily.  . enalapril (VASOTEC) 20 MG tablet Take 20 mg by mouth daily.  . metoprolol tartrate (LOPRESSOR) 25 MG tablet Take 25 mg by mouth 2 (two) times daily.  . Misc Natural Products (TART CHERRY ADVANCED PO) Take 425 mg by mouth every morning.  . Multiple Vitamins-Minerals (AIRBORNE) CHEW Chew 1 tablet by mouth as needed.  Marland Kitchen omeprazole (PRILOSEC OTC) 20 MG tablet Take 20 mg by mouth daily.  Marland Kitchen OVER THE COUNTER MEDICATION Glucosamine MSM complex 1 tablet 2 times daily  . Probiotic Product (RA PROBIOTIC GUMMIES PO) Take 1 Dose by mouth daily.  . rosuvastatin (CRESTOR) 5 MG tablet Take 1 tablet (5 mg total) by mouth daily.  Marland Kitchen triamterene-hydrochlorothiazide (MAXZIDE-25) 37.5-25 MG tablet Take 0.5 tablets by mouth daily.  . TURMERIC PO Take 100 mg by mouth every morning.   Current Facility-Administered Medications for the 08/09/18 encounter (Office Visit) with Paige Records, MD  Medication  . 0.9 %  sodium chloride infusion     Allergies:   Cefdinir; Ciprofloxacin; Codeine; Neosporin [neomycin-bacitracin zn-polymyx]; and Prednisone   Past Medical History:    Diagnosis Date  . Allergy    mild  . Arthritis   . Breast cancer ([place]) 01/1996   with recurrence in 1999- left side   . Cataract    forming  . Diverticulitis 03/22/2018   treatment done with Aumentin  . Endometrioid adenocarcinoma 06/2010   Stage IA, grade 2  . GERD (gastroesophageal reflux disease)   . H/O cesarean section 05/18/1981  . Heart murmur    past hx   . Hyperlipidemia   . Hypertension   . Obesity   . Radiation 1997   for breast ca  . Rapid heart rate   . Skin cancer    basal and squamous cell   . Tibia fracture     Past Surgical History:  Procedure Laterality Date  . BREAST BIOPSY  01/1996  . [place]  . COLONOSCOPY  2008   [place]  . DILATION AND CURETTAGE OF UTERUS  1993, 2003  . LAPAROSCOPIC HYSTERECTOMY  06/2010   BSO, repair of vaginal laceration, hernia repair  . MASTECTOMY  1999  . skin cancer removal  2005, 2007   basal cell and squamous cell     Social History:  The patient  reports that she has never smoked. She has never used smokeless tobacco. She reports that she drinks alcohol. She reports that  she does not use drugs.   Family History:  The patient's family history is not on file. She was adopted.    ROS:  Please see the history of present illness. All other systems are reviewed and  Negative to the above problem except as noted.    PHYSICAL EXAM: VS:  BP (!) 142/78   Pulse 78   Ht 5\' 3"  (1.6 m)   Wt 237 lb 12.8 oz (107.9 kg)   SpO2 97%   BMI 42.12 kg/m   GEN: Morbidly obese 67 yo  in no acute distress  HEENT: normal  Neck: no JVD, carotid bruits, or masses Cardiac: RRR;  Normal S1, S2  no murmurs, rubs, or gallops,no edema  Respiratory:  clear to auscultation bilaterally, normal work of breathing GI: soft, nontender, nondistended, + BS  No hepatomegaly  MS: no deformity Moving all extremities   Skin: warm and dry, no rash Neuro:  Strength and sensation are intact Psych: euthymic mood,  full affect   EKG:  EKG is not ordered today     Lipid Panel No results found for: CHOL, TRIG, HDL, CHOLHDL, VLDL, LDLCALC, LDLDIRECT    Wt Readings from Last 3 Encounters:  08/09/18 237 lb 12.8 oz (107.9 kg)  08/02/18 239 lb (108.4 kg)  07/27/18 238 lb (108 kg)      ASSESSMENT AND PLAN:  1    HTN  BP is still a little high   WIll get BMET today   COnsider increasing maxzide to 1 tab per day   3  HL  Pt just started Crestor   Will get lipids in 2 months with AST  4   Morbid obesity  Needs to lose wt Discussed portion control F/U in 5 months in clinic       Current medicines are reviewed at length with the patient today.  The patient does not have concerns regarding medicines.  Signed, Dorris Carnes, MD  08/09/2018 11:13 AM    [address], [place], [place]  [address] Phone: [phone]; Fax: [phone]

## 2018-08-09 NOTE — Patient Instructions (Signed)
Medication Instructions:  Your physician recommends that you continue on your current medications as directed. Please refer to the Current Medication list given to you today.  If you need a refill on your cardiac medications before your next appointment, please call your pharmacy.   Lab work: Today: BMET In 2 months: LIPIDS, AST  If you have labs (blood work) drawn today and your tests are completely normal, you will receive your results only by: Marland Kitchen MyChart Message (if you have MyChart) OR . A paper copy in the mail If you have any lab test that is abnormal or we need to change your treatment, we will call you to review the results.  Testing/Procedures: NONE  Follow-Up: At [facility], you and your health needs are our priority.  As part of our continuing mission to provide you with exceptional heart care, we have created designated Provider Care Teams.  These Care Teams include your primary Cardiologist (physician) and Advanced Practice Providers (APPs -  Physician Assistants and Nurse Practitioners) who all work together to provide you with the care you need, when you need it. You will need a follow up appointment in:  5 months.  Please call our office 2 months in advance to schedule this appointment.  You may see Dorris Carnes, MD or one of the following Advanced Practice Providers on your designated Care Team: Richardson Dopp, PA-C [place], [place] . Daune Perch, NP  Any Other Special Instructions Will Be Listed Below (If Applicable).

## 2018-08-19 ENCOUNTER — Telehealth: Payer: Self-pay | Admitting: Internal Medicine

## 2018-08-19 DIAGNOSIS — I10 Essential (primary) hypertension: Secondary | ICD-10-CM

## 2018-08-19 MED ORDER — TRIAMTERENE-HCTZ 37.5-25 MG PO TABS: 1.0000 | ORAL_TABLET | Freq: Every day | ORAL | 3 refills | Status: AC

## 2018-08-19 NOTE — Telephone Encounter (Signed)
New message   Patient is returning call for lab work.

## 2018-08-19 NOTE — Telephone Encounter (Signed)
Notes recorded by Fay Records, MD on 08/12/2018 at 12:00 AM EDT Electrolytes are OK except for glucose which is mildly elevated Could try increaseing Maxzide to 37.5/25  One whole tab Keep track of BP Check BMET in 3 wks  I spoke with pt and reviewed lab results and recommendations from Dr. Harrington Challenger with her. Will send prescription to [facility] in [place]. Pt will come to office for lab work on 11/27 and bring BP log with her.

## 2018-09-08 ENCOUNTER — Other Ambulatory Visit: Payer: Medicare Other

## 2018-09-14 ENCOUNTER — Other Ambulatory Visit: Payer: Self-pay | Admitting: Internal Medicine

## 2018-09-14 MED ORDER — METOPROLOL TARTRATE 25 MG PO TABS: 25.0000 mg | ORAL_TABLET | Freq: Two times a day (BID) | ORAL | 3 refills | Status: AC

## 2018-09-14 NOTE — Telephone Encounter (Signed)
Pt is calling requesting a refill on metoprolol. Dr. Harrington Challenger did not prescribe this medication and pt states that the doctor that prescribed it is unable to continue to prescribe this medication. Would Dr. Harrington Challenger like to refill this medication? Please address

## 2018-09-14 NOTE — Telephone Encounter (Signed)
Spoke with pt. Pt states that she has been taking metoprolol tartrate 25 mg twice a day for several years. this medication was prescribed by her Cardiologist Dr Sabra Heck in [place], [place]. Near a year ago. Pt changed Cardiologist to Dr. Harrington Challenger. Pt's last visit with Dr Harrington Challenger was on 08/09/18. Metoprolol Tartrate 25 mg one tablet by mouth twice a day was electronically sent to [place] pt is aware.

## 2018-09-15 NOTE — Telephone Encounter (Signed)
OK to prescribe

## 2018-09-16 ENCOUNTER — Ambulatory Visit: Payer: Medicare Other | Admitting: Family Medicine

## 2018-09-24 ENCOUNTER — Other Ambulatory Visit: Payer: Medicare Other | Admitting: *Deleted

## 2018-09-24 DIAGNOSIS — I10 Essential (primary) hypertension: Secondary | ICD-10-CM | POA: Diagnosis not present

## 2018-09-25 LAB — BASIC METABOLIC PANEL
BUN/Creatinine Ratio: 20 (ref 12–28)
BUN: 16 mg/dL (ref 8–27)
CO2: 25 mmol/L (ref 20–29)
CREATININE: 0.82 mg/dL (ref 0.57–1.00)
Calcium: 9.4 mg/dL (ref 8.7–10.3)
Chloride: 97 mmol/L (ref 96–106)
GFR calc Af Amer: 86 mL/min/{1.73_m2} (ref >59)
GFR calc non Af Amer: 74 mL/min/{1.73_m2} (ref >59)
GLUCOSE: 156 mg/dL — AB (ref 65–99)
Potassium: 4.6 mmol/L (ref 3.5–5.2)
SODIUM: 138 mmol/L (ref 134–144)

## 2018-09-27 DIAGNOSIS — E7849 Other hyperlipidemia: Secondary | ICD-10-CM | POA: Diagnosis not present

## 2018-09-27 DIAGNOSIS — R609 Edema, unspecified: Secondary | ICD-10-CM | POA: Diagnosis not present

## 2018-09-27 DIAGNOSIS — I10 Essential (primary) hypertension: Secondary | ICD-10-CM | POA: Diagnosis not present

## 2018-09-27 DIAGNOSIS — E1169 Type 2 diabetes mellitus with other specified complication: Secondary | ICD-10-CM | POA: Diagnosis not present

## 2018-10-01 DIAGNOSIS — R42 Dizziness and giddiness: Secondary | ICD-10-CM | POA: Diagnosis not present

## 2018-10-01 DIAGNOSIS — R0981 Nasal congestion: Secondary | ICD-10-CM | POA: Diagnosis not present

## 2018-10-01 DIAGNOSIS — H8110 Benign paroxysmal vertigo, unspecified ear: Secondary | ICD-10-CM | POA: Diagnosis not present

## 2018-10-01 DIAGNOSIS — J3489 Other specified disorders of nose and nasal sinuses: Secondary | ICD-10-CM | POA: Diagnosis not present

## 2018-10-15 ENCOUNTER — Other Ambulatory Visit: Payer: Medicare Other | Admitting: *Deleted

## 2018-10-15 DIAGNOSIS — E785 Hyperlipidemia, unspecified: Secondary | ICD-10-CM

## 2018-10-15 DIAGNOSIS — I10 Essential (primary) hypertension: Secondary | ICD-10-CM

## 2018-10-16 LAB — LIPID PANEL
CHOL/HDL RATIO: 3.2 ratio (ref 0.0–4.4)
Cholesterol, Total: 174 mg/dL (ref 100–199)
HDL: 55 mg/dL (ref >39)
LDL Calculated: 65 mg/dL (ref 0–99)
Triglycerides: 272 mg/dL — ABNORMAL HIGH (ref 0–149)
VLDL Cholesterol Cal: 54 mg/dL — ABNORMAL HIGH (ref 5–40)

## 2018-10-16 LAB — AST: AST: 20 IU/L (ref 0–40)

## 2018-10-20 DIAGNOSIS — Z779 Other contact with and (suspected) exposures hazardous to health: Secondary | ICD-10-CM | POA: Diagnosis not present

## 2018-10-20 DIAGNOSIS — Z6841 Body Mass Index (BMI) 40.0 and over, adult: Secondary | ICD-10-CM | POA: Diagnosis not present

## 2018-10-28 DIAGNOSIS — Z853 Personal history of malignant neoplasm of breast: Secondary | ICD-10-CM | POA: Diagnosis not present

## 2018-10-28 DIAGNOSIS — Z1231 Encounter for screening mammogram for malignant neoplasm of breast: Secondary | ICD-10-CM | POA: Diagnosis not present

## 2018-11-01 ENCOUNTER — Ambulatory Visit (INDEPENDENT_AMBULATORY_CARE_PROVIDER_SITE_OTHER): Payer: Medicare Other | Admitting: Family Medicine

## 2018-11-01 ENCOUNTER — Encounter: Payer: Self-pay | Admitting: Family Medicine

## 2018-11-01 VITALS — BP 130/82 | HR 88 | Ht 63.0 in | Wt 237.0 lb

## 2018-11-01 DIAGNOSIS — M25562 Pain in left knee: Secondary | ICD-10-CM

## 2018-11-01 DIAGNOSIS — M25561 Pain in right knee: Secondary | ICD-10-CM

## 2018-11-01 DIAGNOSIS — M170 Bilateral primary osteoarthritis of knee: Secondary | ICD-10-CM | POA: Diagnosis not present

## 2018-11-01 NOTE — Patient Instructions (Signed)
Good to see you  Ice is your friend Stay active [employer] orthotics "total support" online would be great  See me again in 2 months to make sure you are doing better

## 2018-11-01 NOTE — Progress Notes (Signed)
[name] [address], [place] [address] Phone: [phone] Subjective:    I Paige Mccann am serving as a Education administrator for Dr. Hulan Saas.   CC: Bilateral knee pain follow-up  [id number]  Paige Mccann is a 68 y.o. female coming in with complaint of bilateral knee pain. States the knees are doing better. Patient was found to have arthritic changes.  Been doing the home exercises occasionally but not very frequently.  Would state then feeling approximately 30 to 40% better.  Has noticed some less swelling.  No side effects to any of the vitamin supplementations.  Did not use any of the topical anti-inflammatories.    Past Medical History:  Diagnosis Date  . Allergy    mild  . Arthritis   . Breast cancer ([place]) 01/1996   with recurrence in 1999- left side   . Cataract    forming  . Diverticulitis 03/22/2018   treatment done with Aumentin  . Endometrioid adenocarcinoma 06/2010   Stage IA, grade 2  . GERD (gastroesophageal reflux disease)   . H/O cesarean section 05/18/1981  . Heart murmur    past hx   . Hyperlipidemia   . Hypertension   . Obesity   . Radiation 1997   for breast ca  . Rapid heart rate   . Skin cancer    basal and squamous cell   . Tibia fracture    Past Surgical History:  Procedure Laterality Date  . BREAST BIOPSY  01/1996  . [place]  . COLONOSCOPY  [place]   [place]  . DILATION AND CURETTAGE OF UTERUS  1993, 2003  . LAPAROSCOPIC HYSTERECTOMY  06/2010   BSO, repair of vaginal laceration, hernia repair  . MASTECTOMY  1999  . skin cancer removal  2005, 2007   basal cell and squamous cell   Social History   Socioeconomic History  . Marital status: Married    Spouse name: Not on file  . Number of children: 1  . Years of education: COLLEGE  . Highest education level: Not on file  Occupational History  . Not on file  Social Needs  . Financial resource strain: Not on  file  . Food insecurity:    Worry: Not on file    Inability: Not on file  . Transportation needs:    Medical: Not on file    Non-medical: Not on file  Tobacco Use  . Smoking status: Never Smoker  . Smokeless tobacco: Never Used  Substance and Sexual Activity  . Alcohol use: Yes    Comment: onec or twice a year  . Drug use: No  . Sexual activity: Not on file  Lifestyle  . Physical activity:    Days per week: Not on file    Minutes per session: Not on file  . Stress: Not on file  Relationships  . Social connections:    Talks on phone: Not on file    Gets together: Not on file    Attends religious service: Not on file    Active member of club or organization: Not on file    Attends meetings of clubs or organizations: Not on file    Relationship status: Not on file  Other Topics Concern  . Not on file  Social History Narrative  . Not on file   Allergies  Allergen Reactions  . Cefdinir Other (See Comments)    weakness  . Ciprofloxacin  Dizziness  . Codeine     "feel weird"  . Neosporin [Neomycin-Bacitracin Zn-Polymyx] Itching    redness  . Prednisone     "feel wired"   Family History  Adopted: Yes      Current Outpatient Medications (Cardiovascular):  .  enalapril (VASOTEC) 20 MG tablet, Take 20 mg by mouth daily. .  metoprolol tartrate (LOPRESSOR) 25 MG tablet, Take 1 tablet (25 mg total) by mouth 2 (two) times daily. .  rosuvastatin (CRESTOR) 5 MG tablet, Take 1 tablet (5 mg total) by mouth daily. Marland Kitchen  triamterene-hydrochlorothiazide (MAXZIDE-25) 37.5-25 MG tablet, Take 1 tablet by mouth daily.     Current Outpatient Medications (Analgesics):  .  aspirin EC 81 MG tablet, Take 81 mg by mouth as directed. Monday, Wednesday, and Friday     Current Outpatient Medications (Other):  Marland Kitchen  Cholecalciferol (VITAMIN D3) 1000 units CAPS, Take 1,000 Units by mouth daily. .  Misc Natural Products (TART CHERRY ADVANCED PO), Take 425 mg by mouth every morning. .   Multiple Vitamins-Minerals (AIRBORNE) CHEW, Chew 1 tablet by mouth as needed. Marland Kitchen  omeprazole (PRILOSEC OTC) 20 MG tablet, Take 20 mg by mouth daily. Marland Kitchen  OVER THE COUNTER MEDICATION, Glucosamine MSM complex 1 tablet 2 times daily .  Probiotic Product (RA PROBIOTIC GUMMIES PO), Take 1 Dose by mouth daily. .  TURMERIC PO, Take 100 mg by mouth every morning.  Current Facility-Administered Medications (Other):  .  0.9 %  sodium chloride infusion    Past medical history, social, surgical and family history all reviewed in electronic medical record.  No pertanent information unless stated regarding to the chief complaint.   Review of Systems:  No headache, visual changes, nausea, vomiting, diarrhea, constipation, dizziness, abdominal pain, skin rash, fevers, chills, night sweats, weight loss, swollen lymph nodes,  muscle aches, chest pain, shortness of breath, mood changes.  Positive muscle aches, body aches, joint swelling  Objective  Blood pressure 130/82, pulse 88, height 5\' 3"  (1.6 m), weight 237 lb (107.5 kg), SpO2 97 %.    General: No apparent distress alert and oriented x3 mood and affect normal, dressed appropriately.  HEENT: Pupils equal, extraocular movements intact  Respiratory: Patient's speak in full sentences and does not appear short of breath  Cardiovascular: 1+ lower extremity edema, non tender, no erythema very mild hemosiderin deposits Skin: Warm dry intact with no signs of infection or rash on extremities or on axial skeleton.  Abdomen: Soft nontender  Neuro: Cranial nerves II through XII are intact, neurovascularly intact in all extremities with 2+ DTRs and 2+ pulses.  Lymph: No lymphadenopathy of posterior or anterior cervical chain or axillae bilaterally.  Gait severely antalgic walking with the aid of a cane MSK:  tender with mild limited range of motion and good stability and symmetric strength and tone of shoulders, elbows, wrist, hip, and ankles bilaterally.  Knee:  Bilateral valgus deformity noted. Large thigh to calf ratio.  Tender to palpation over medial and PF joint line.  Less than previous exam ROM full in flexion and extension and lower leg rotation. instability with valgus force.  painful patellar compression. Patellar glide with moderate crepitus. Patellar and quadriceps tendons unremarkable. Hamstring and quadriceps strength is normal.     Impression and Recommendations:      The above documentation has been reviewed and is accurate and complete Paige Pulley, DO       Note: This dictation was prepared with Dragon dictation along with smaller phrase technology. Any  transcriptional errors that result from this process are unintentional.

## 2018-11-01 NOTE — Assessment & Plan Note (Signed)
Doing well.  Does have severe arthritic changes.  Patient has been somewhat noncompliant.  Continue conservative therapy at this point.  Plans injections if necessary.  Follow-up again in 8 weeks.

## 2018-12-06 DIAGNOSIS — H25813 Combined forms of age-related cataract, bilateral: Secondary | ICD-10-CM | POA: Diagnosis not present

## 2018-12-06 DIAGNOSIS — H40023 Open angle with borderline findings, high risk, bilateral: Secondary | ICD-10-CM | POA: Diagnosis not present

## 2018-12-16 DIAGNOSIS — Z853 Personal history of malignant neoplasm of breast: Secondary | ICD-10-CM | POA: Diagnosis not present

## 2018-12-16 DIAGNOSIS — J0100 Acute maxillary sinusitis, unspecified: Secondary | ICD-10-CM | POA: Diagnosis not present

## 2018-12-16 DIAGNOSIS — I10 Essential (primary) hypertension: Secondary | ICD-10-CM | POA: Diagnosis not present

## 2018-12-27 DIAGNOSIS — C50912 Malignant neoplasm of unspecified site of left female breast: Secondary | ICD-10-CM | POA: Diagnosis not present

## 2018-12-27 DIAGNOSIS — Z85828 Personal history of other malignant neoplasm of skin: Secondary | ICD-10-CM | POA: Diagnosis not present

## 2018-12-27 DIAGNOSIS — Z8589 Personal history of malignant neoplasm of other organs and systems: Secondary | ICD-10-CM | POA: Diagnosis not present

## 2019-01-03 ENCOUNTER — Telehealth: Payer: Self-pay | Admitting: Internal Medicine

## 2019-01-03 ENCOUNTER — Ambulatory Visit: Payer: Medicare Other | Admitting: Family Medicine

## 2019-01-03 NOTE — Telephone Encounter (Signed)
Unable to reach patient   Number disconnected I do not need to see pt  She has HTN and is followed by Dr Joya Salm. No need to reschedule

## 2019-01-04 NOTE — Telephone Encounter (Signed)
Called pt to cancel appt on 3/27 per Dr. Harrington Challenger there is no need to reschedule  Pt has no acute cardiac symptoms at this time Advised pt to call back if any new symptoms present

## 2019-01-07 ENCOUNTER — Ambulatory Visit: Payer: Medicare Other | Admitting: Internal Medicine

## 2019-01-27 ENCOUNTER — Ambulatory Visit: Payer: Medicare Other | Admitting: Family Medicine

## 2019-02-09 DIAGNOSIS — I10 Essential (primary) hypertension: Secondary | ICD-10-CM | POA: Diagnosis not present

## 2019-02-09 DIAGNOSIS — E7849 Other hyperlipidemia: Secondary | ICD-10-CM | POA: Diagnosis not present

## 2019-02-09 DIAGNOSIS — E1169 Type 2 diabetes mellitus with other specified complication: Secondary | ICD-10-CM | POA: Diagnosis not present

## 2019-02-11 DIAGNOSIS — I10 Essential (primary) hypertension: Secondary | ICD-10-CM | POA: Diagnosis not present

## 2019-02-11 DIAGNOSIS — R82998 Other abnormal findings in urine: Secondary | ICD-10-CM | POA: Diagnosis not present

## 2019-02-17 DIAGNOSIS — E1169 Type 2 diabetes mellitus with other specified complication: Secondary | ICD-10-CM | POA: Diagnosis not present

## 2019-02-17 DIAGNOSIS — Z1339 Encounter for screening examination for other mental health and behavioral disorders: Secondary | ICD-10-CM | POA: Diagnosis not present

## 2019-02-17 DIAGNOSIS — I10 Essential (primary) hypertension: Secondary | ICD-10-CM | POA: Diagnosis not present

## 2019-02-17 DIAGNOSIS — Z8719 Personal history of other diseases of the digestive system: Secondary | ICD-10-CM | POA: Diagnosis not present

## 2019-02-17 DIAGNOSIS — R269 Unspecified abnormalities of gait and mobility: Secondary | ICD-10-CM | POA: Diagnosis not present

## 2019-02-17 DIAGNOSIS — Z0000 Encounter for general adult medical examination without abnormal findings: Secondary | ICD-10-CM | POA: Diagnosis not present

## 2019-02-17 DIAGNOSIS — K219 Gastro-esophageal reflux disease without esophagitis: Secondary | ICD-10-CM | POA: Diagnosis not present

## 2019-02-17 DIAGNOSIS — Z1331 Encounter for screening for depression: Secondary | ICD-10-CM | POA: Diagnosis not present

## 2019-02-17 DIAGNOSIS — E785 Hyperlipidemia, unspecified: Secondary | ICD-10-CM | POA: Diagnosis not present

## 2019-02-20 IMAGING — DX DG KNEE STANDING AP BILAT
1 series · 1 of 1 positions shown · non-contrast
Comparison: None.

CLINICAL DATA: Chronic pain of both knees

EXAM:
BILATERAL KNEES STANDING - 1 VIEW

[knee ap]
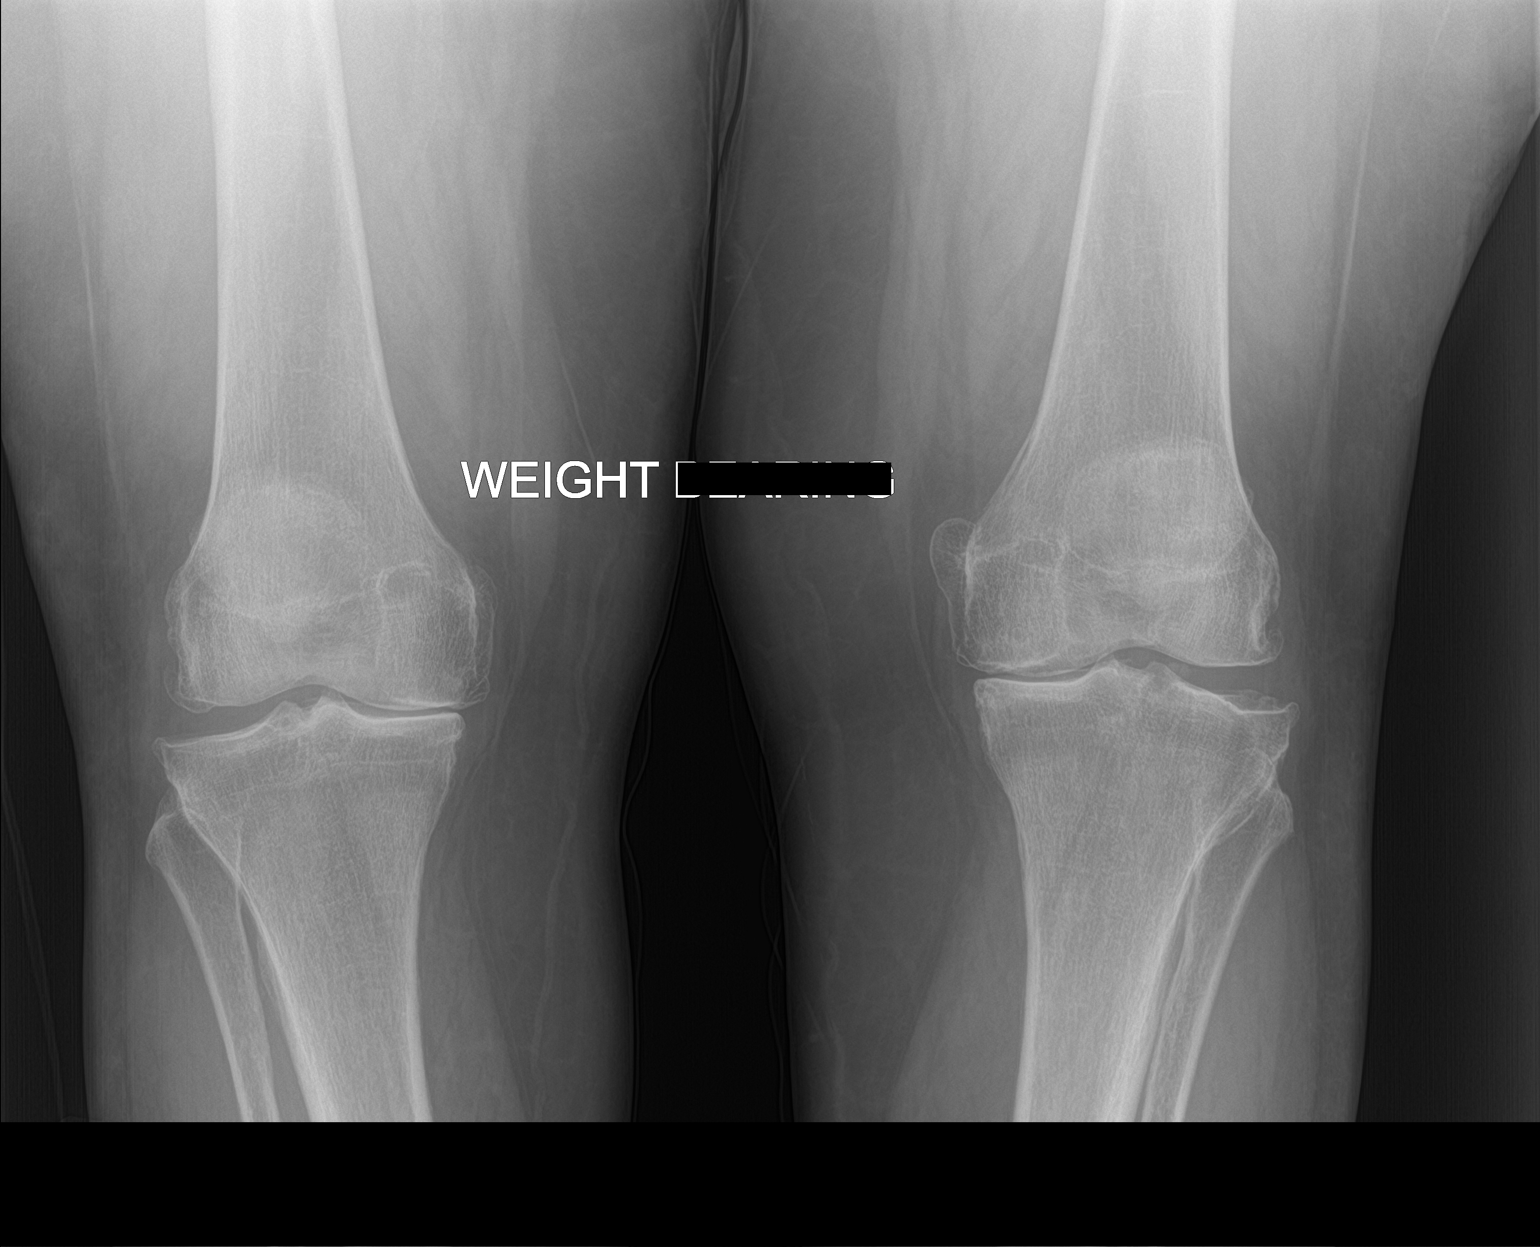

[1 of 1 positions shown; findings below may reference images not displayed]

FINDINGS: Degenerative marginal spurring and advanced medial compartment
narrowing on both sides. There is lateral translation of the tibial
plateau on both sides. No evidence of fracture or bone lesion.
IMPRESSION: Osteoarthritis with advanced medial compartment narrowing.

## 2019-03-07 NOTE — Progress Notes (Signed)
[facility] [address] [place], [place] [address] Phone: [phone] Subjective:     CC: Bilateral knee pain follow-up  [id number]   11/01/2018: Doing well.  Does have severe arthritic changes.  Patient has been somewhat noncompliant.  Continue conservative therapy at this point.  Plans injections if necessary.  Follow-up again in 8 weeks.  Update 03/08/2019: Paige Mccann is a 68 y.o. female coming in with complaint of bilateral knee pain. Pain is intermittent. Is not having pain today but if she walks around a lot she does have pain.    Is also having pain at the based of the achilles on right foot. Was unable to get the orthotics. Pain in heel is intermittent.  Patient has been walking on a more regular basis.  Feels like this is possibly been exacerbated.  Has been intermittently using her husband's stationary bike but does not change the angle of the seat.  Sometimes feels like she reaches for the pedals and this can cause more discomfort as well.    Past Medical History:  Diagnosis Date  . Allergy    mild  . Arthritis   . Breast cancer ([place]) 01/1996   with recurrence in 1999- left side   . Cataract    forming  . Diverticulitis 03/22/2018   treatment done with Aumentin  . Endometrioid adenocarcinoma 06/2010   Stage IA, grade 2  . GERD (gastroesophageal reflux disease)   . H/O cesarean section 05/18/1981  . Heart murmur    past hx   . Hyperlipidemia   . Hypertension   . Obesity   . Radiation 1997   for breast ca  . Rapid heart rate   . Skin cancer    basal and squamous cell   . Tibia fracture    Past Surgical History:  Procedure Laterality Date  . BREAST BIOPSY  01/1996  . [place]  . COLONOSCOPY  2008   [place]  . DILATION AND CURETTAGE OF UTERUS  1993, 2003  . LAPAROSCOPIC HYSTERECTOMY  06/2010   BSO, repair of vaginal laceration, hernia repair  . MASTECTOMY  1999  . skin cancer  removal  2005, 2007   basal cell and squamous cell   Social History   Socioeconomic History  . Marital status: Married    Spouse name: Not on file  . Number of children: 1  . Years of education: COLLEGE  . Highest education level: Not on file  Occupational History  . Not on file  Social Needs  . Financial resource strain: Not on file  . Food insecurity:    Worry: Not on file    Inability: Not on file  . Transportation needs:    Medical: Not on file    Non-medical: Not on file  Tobacco Use  . Smoking status: Never Smoker  . Smokeless tobacco: Never Used  Substance and Sexual Activity  . Alcohol use: Yes    Comment: onec or twice a year  . Drug use: No  . Sexual activity: Not on file  Lifestyle  . Physical activity:    Days per week: Not on file    Minutes per session: Not on file  . Stress: Not on file  Relationships  . Social connections:    Talks on phone: Not on file    Gets together: Not on file    Attends religious service: Not on file    Active member of club or organization:  Not on file    Attends meetings of clubs or organizations: Not on file    Relationship status: Not on file  Other Topics Concern  . Not on file  Social History Narrative  . Not on file   Allergies  Allergen Reactions  . Cefdinir Other (See Comments)    weakness  . Ciprofloxacin     Dizziness  . Codeine     "feel weird"  . Neosporin [Neomycin-Bacitracin Zn-Polymyx] Itching    redness  . Prednisone     "feel wired"   Family History  Adopted: Yes      Current Outpatient Medications (Cardiovascular):  .  enalapril (VASOTEC) 20 MG tablet, Take 20 mg by mouth daily. .  metoprolol tartrate (LOPRESSOR) 25 MG tablet, Take 1 tablet (25 mg total) by mouth 2 (two) times daily. .  rosuvastatin (CRESTOR) 5 MG tablet, Take 1 tablet (5 mg total) by mouth daily. Marland Kitchen  triamterene-hydrochlorothiazide (MAXZIDE-25) 37.5-25 MG tablet, Take 1 tablet by mouth daily.     Current Outpatient  Medications (Analgesics):  .  aspirin EC 81 MG tablet, Take 81 mg by mouth as directed. Monday, Wednesday, and Friday     Current Outpatient Medications (Other):  Marland Kitchen  Cholecalciferol (VITAMIN D3) 1000 units CAPS, Take 1,000 Units by mouth daily. .  Misc Natural Products (TART CHERRY ADVANCED PO), Take 425 mg by mouth every morning. .  Multiple Vitamins-Minerals (AIRBORNE) CHEW, Chew 1 tablet by mouth as needed. Marland Kitchen  omeprazole (PRILOSEC OTC) 20 MG tablet, Take 20 mg by mouth daily. Marland Kitchen  OVER THE COUNTER MEDICATION, Glucosamine MSM complex 1 tablet 2 times daily .  Probiotic Product (RA PROBIOTIC GUMMIES PO), Take 1 Dose by mouth daily. .  TURMERIC PO, Take 100 mg by mouth every morning.  Current Facility-Administered Medications (Other):  .  0.9 %  sodium chloride infusion    Past medical history, social, surgical and family history all reviewed in electronic medical record.  No pertanent information unless stated regarding to the chief complaint.   Review of Systems:  No headache, visual changes, nausea, vomiting, diarrhea, constipation, dizziness, abdominal pain, skin rash, fevers, chills, night sweats, weight loss, swollen lymph nodes, body aches, joint swelling,  chest pain, shortness of breath, mood changes.  Positive muscle aches  Objective  Blood pressure 130/82, pulse 77, height 5\' 3"  (1.6 m), SpO2 99 %.    General: No apparent distress alert and oriented x3 mood and affect normal, dressed appropriately.  HEENT: Pupils equal, extraocular movements intact  Respiratory: Patient's speak in full sentences and does not appear short of breath  Cardiovascular: Trace lower extremity edema, tender, no erythema  Skin: Warm dry intact with no signs of infection or rash on extremities or on axial skeleton.  Abdomen: Soft nontender  Neuro: Cranial nerves II through XII are intact, neurovascularly intact in all extremities with 2+ DTRs and 2+ pulses.  Lymph: No lymphadenopathy of posterior  or anterior cervical chain or axillae bilaterally.  Gait antalgic gait MSK:  Non tender with full range of motion and good stability and symmetric strength and tone of shoulders, elbows, wrist, hip, bilaterally.  Bilateral knees show that patient does have some valgus deformity bilaterally.  Instability noted with varus force.  Patient lacks last 5 degrees. Patient's right ankle shows a Haglund nodule posteriorly that is inflamed at the moment.  Erythema, and tenderness to palpation.  Achilles is intact.  5 out of 5 strength of the ankle but does have some underlying arthritic  changes.   [id number]; 15 additional minutes spent for Therapeutic exercises as stated in above notes.  This included exercises focusing on stretching, strengthening, with significant focus on eccentric aspects.   Long term goals include an improvement in range of motion, strength, endurance as well as avoiding reinjury. Patient's frequency would include in 1-2 times a day, 3-5 times a week for a duration of 6-12 weeks. Ankle strengthening that included:  Basic range of motion exercises to allow proper full motion at ankle Stretching of the lower leg and hamstrings  Theraband exercises for the lower leg - inversion, eversion, dorsiflexion and plantarflexion each to be completed with a theraband Balance exercises to increase proprioception Weight bearing exercises to increase strength and balance  Proper technique shown and discussed handout in great detail with ATC.  All questions were discussed and answered.    Impression and Recommendations:     This case required medical decision making of moderate complexity. The above documentation has been reviewed and is accurate and complete Lyndal Pulley, DO       Note: This dictation was prepared with Dragon dictation along with smaller phrase technology. Any transcriptional errors that result from this process are unintentional.

## 2019-03-08 ENCOUNTER — Other Ambulatory Visit: Payer: Self-pay

## 2019-03-08 ENCOUNTER — Encounter: Payer: Self-pay | Admitting: Family Medicine

## 2019-03-08 ENCOUNTER — Ambulatory Visit (INDEPENDENT_AMBULATORY_CARE_PROVIDER_SITE_OTHER): Payer: Medicare Other | Admitting: Family Medicine

## 2019-03-08 DIAGNOSIS — M170 Bilateral primary osteoarthritis of knee: Secondary | ICD-10-CM | POA: Diagnosis not present

## 2019-03-08 DIAGNOSIS — M7661 Achilles tendinitis, right leg: Secondary | ICD-10-CM | POA: Diagnosis not present

## 2019-03-08 NOTE — Assessment & Plan Note (Signed)
Achilles tendinitis.  Discussed icing regimen and home exercises, discussed which activities of doing which wants to avoid.  Patient will get a heel lift, over-the-counter orthotics, topical anti-inflammatories, icing regimen.  Follow-up again in 4 to 6 weeks

## 2019-03-08 NOTE — Assessment & Plan Note (Signed)
Patient seems to be doing relatively well though.  Continue conservative therapy, encourage patient to consider weight loss.  Patient wants to avoid any type of injections or other further interventions if possible.  Patient will increase activity slowly.  Follow-up again in 8 weeks

## 2019-03-08 NOTE — Patient Instructions (Signed)
Good to see you  Ice 20 minutes 2 times daily. Usually after activity and before bed. Heel lift in right shoe Stay active but do not walk barefoot.  Maybe wear a large bandaid on back of ankle.  See me again in 8 weeks

## 2019-05-02 ENCOUNTER — Encounter: Payer: Self-pay | Admitting: Family Medicine

## 2019-05-02 ENCOUNTER — Ambulatory Visit (INDEPENDENT_AMBULATORY_CARE_PROVIDER_SITE_OTHER): Payer: Medicare Other | Admitting: Family Medicine

## 2019-05-02 ENCOUNTER — Other Ambulatory Visit: Payer: Self-pay

## 2019-05-02 DIAGNOSIS — M7661 Achilles tendinitis, right leg: Secondary | ICD-10-CM

## 2019-05-02 DIAGNOSIS — M170 Bilateral primary osteoarthritis of knee: Secondary | ICD-10-CM | POA: Diagnosis not present

## 2019-05-02 NOTE — Assessment & Plan Note (Signed)
Patient feels like she is doing relatively well at this point.  We discussed different treatment options including injections with patient declined.  Wants to continue with conservative therapy.  Patient is taking care of her husband and feels like she needs to focus on little bit more at the moment.  Declined other type treatment such as physical therapy.  Follow-up with me again in 6 to 12 weeks.

## 2019-05-02 NOTE — Assessment & Plan Note (Addendum)
Patient is doing relatively well.  Haglund nodule has decreased in size.  Patient is doing well with the heel lift.  Encourage her to do the exercises on a more regular basis.  Patient continues to ambulate with a cane at the moment.  Spent  25 minutes with patient face-to-face and had greater than 50% of counseling including as described above in assessment and plan.

## 2019-05-02 NOTE — Progress Notes (Signed)
[facility] [address], [place] [address] Phone: [phone] Subjective:   I Paige Mccann am serving as a Education administrator for Dr. Hulan Saas.    CC: Bilateral knee and ankle follow-up  KGM:[id number]   03/08/2019 Patient seems to be doing relatively well though.  Continue conservative therapy, encourage patient to consider weight loss.  Patient wants to avoid any type of injections or other further interventions if possible.  Patient will increase activity slowly.  Follow-up again in 8 weeks  Achilles tendinitis.  Discussed icing regimen and home exercises, discussed which activities of doing which wants to avoid.  Patient will get a heel lift, over-the-counter orthotics, topical anti-inflammatories, icing regimen.  Follow-up again in 4 to 6 weeks  05/02/2019 Paige Mccann is a 68 y.o. female coming in with complaint of knee and foot pain. States she is feeling better with the heel lift. States she hasn't really been active.  Patient states that she has found it difficult to do the exercises on a regular basis because she is taking care of her husband.  Denies any new symptoms.  States that overall 50% better.    Past Medical History:  Diagnosis Date  . Allergy    mild  . Arthritis   . Breast cancer ([place]) 01/1996   with recurrence in 1999- left side   . Cataract    forming  . Diverticulitis 03/22/2018   treatment done with Aumentin  . Endometrioid adenocarcinoma 06/2010   Stage IA, grade 2  . GERD (gastroesophageal reflux disease)   . H/O cesarean section 05/18/1981  . Heart murmur    past hx   . Hyperlipidemia   . Hypertension   . Obesity   . Radiation 1997   for breast ca  . Rapid heart rate   . Skin cancer    basal and squamous cell   . Tibia fracture    Past Surgical History:  Procedure Laterality Date  . BREAST BIOPSY  01/1996  . [place]  . COLONOSCOPY  2008   [place] exam  . DILATION AND  CURETTAGE OF UTERUS  1993, 2003  . LAPAROSCOPIC HYSTERECTOMY  06/2010   BSO, repair of vaginal laceration, hernia repair  . MASTECTOMY  1999  . skin cancer removal  2005, 2007   basal cell and squamous cell   Social History   Socioeconomic History  . Marital status: Married    Spouse name: Not on file  . Number of children: 1  . Years of education: COLLEGE  . Highest education level: Not on file  Occupational History  . Not on file  Social Needs  . Financial resource strain: Not on file  . Food insecurity    Worry: Not on file    Inability: Not on file  . Transportation needs    Medical: Not on file    Non-medical: Not on file  Tobacco Use  . Smoking status: Never Smoker  . Smokeless tobacco: Never Used  Substance and Sexual Activity  . Alcohol use: Yes    Comment: onec or twice a year  . Drug use: No  . Sexual activity: Not on file  Lifestyle  . Physical activity    Days per week: Not on file    Minutes per session: Not on file  . Stress: Not on file  Relationships  . Social Herbalist on phone: Not on file    Gets together: Not on  file    Attends religious service: Not on file    Active member of club or organization: Not on file    Attends meetings of clubs or organizations: Not on file    Relationship status: Not on file  Other Topics Concern  . Not on file  Social History Narrative  . Not on file   Allergies  Allergen Reactions  . Cefdinir Other (See Comments)    weakness  . Ciprofloxacin     Dizziness  . Codeine     "feel weird"  . Neosporin [Neomycin-Bacitracin Zn-Polymyx] Itching    redness  . Prednisone     "feel wired"   Family History  Adopted: Yes      Current Outpatient Medications (Cardiovascular):  .  enalapril (VASOTEC) 20 MG tablet, Take 20 mg by mouth daily. .  metoprolol tartrate (LOPRESSOR) 25 MG tablet, Take 1 tablet (25 mg total) by mouth 2 (two) times daily. .  rosuvastatin (CRESTOR) 5 MG tablet, Take 1 tablet (5  mg total) by mouth daily. Marland Kitchen  triamterene-hydrochlorothiazide (MAXZIDE-25) 37.5-25 MG tablet, Take 1 tablet by mouth daily.     Current Outpatient Medications (Analgesics):  .  aspirin EC 81 MG tablet, Take 81 mg by mouth as directed. Monday, Wednesday, and Friday     Current Outpatient Medications (Other):  Marland Kitchen  Cholecalciferol (VITAMIN D3) 1000 units CAPS, Take 1,000 Units by mouth daily. .  Misc Natural Products (TART CHERRY ADVANCED PO), Take 425 mg by mouth every morning. .  Multiple Vitamins-Minerals (AIRBORNE) CHEW, Chew 1 tablet by mouth as needed. Marland Kitchen  omeprazole (PRILOSEC OTC) 20 MG tablet, Take 20 mg by mouth daily. Marland Kitchen  OVER THE COUNTER MEDICATION, Glucosamine MSM complex 1 tablet 2 times daily .  Probiotic Product (RA PROBIOTIC GUMMIES PO), Take 1 Dose by mouth daily. .  TURMERIC PO, Take 100 mg by mouth every morning.  Current Facility-Administered Medications (Other):  .  0.9 %  sodium chloride infusion    Past medical history, social, surgical and family history all reviewed in electronic medical record.  No pertanent information unless stated regarding to the chief complaint.   Review of Systems:  No headache, visual changes, nausea, vomiting, diarrhea, constipation, dizziness, abdominal pain, skin rash, fevers, chills, night sweats, weight loss, swollen lymph nodes, body aches, joint swelling,  chest pain, shortness of breath, mood changes.  Positive muscle aches  Objective  Blood pressure 140/90, pulse 79, height 5\' 3"  (1.6 m), weight 237 lb (107.5 kg), SpO2 98 %.    General: No apparent distress alert and oriented x3 mood and affect normal, dressed appropriately.  HEENT: Pupils equal, extraocular movements intact  Respiratory: Patient's speak in full sentences and does not appear short of breath  Cardiovascular: No lower extremity edema, non tender, no erythema  Skin: Warm dry intact with no signs of infection or rash on extremities or on axial skeleton.  Abdomen:  Soft nontender  Neuro: Cranial nerves II through XII are intact, neurovascularly intact in all extremities with 2+ DTRs and 2+ pulses.  Lymph: No lymphadenopathy of posterior or anterior cervical chain or axillae bilaterally.  MSK:  Non tender with full range of motion and good stability and symmetric strength and tone of shoulders, elbows, wrist, hip, bilaterally.  Antalgic gait noted. Right ankle exam shows the patient does have mild arthritic changes.  Haglund nodule has decreased in size.  He still tender to palpation.  Minimal tightness of the Achilles noted minimal improvement in range of motion.  Bilateral knee exam shows positive lateral tracking of the kneecap.  Patient does have more of a arthritic changes with tender to palpation over the medial joint line.  Mild instability with valgus force.  Lacks last 5 degrees of extension and flexion.  Neurovascular intact distally   Impression and Recommendations:     This case required medical decision making of moderate complexity. The above documentation has been reviewed and is accurate and complete Lyndal Pulley, DO       Note: This dictation was prepared with Dragon dictation along with smaller phrase technology. Any transcriptional errors that result from this process are unintentional.

## 2019-05-03 ENCOUNTER — Ambulatory Visit: Payer: Medicare Other | Admitting: Family Medicine

## 2019-06-23 DIAGNOSIS — E785 Hyperlipidemia, unspecified: Secondary | ICD-10-CM | POA: Diagnosis not present

## 2019-06-23 DIAGNOSIS — R269 Unspecified abnormalities of gait and mobility: Secondary | ICD-10-CM | POA: Diagnosis not present

## 2019-06-23 DIAGNOSIS — I10 Essential (primary) hypertension: Secondary | ICD-10-CM | POA: Diagnosis not present

## 2019-06-23 DIAGNOSIS — Z8719 Personal history of other diseases of the digestive system: Secondary | ICD-10-CM | POA: Diagnosis not present

## 2019-06-23 DIAGNOSIS — L309 Dermatitis, unspecified: Secondary | ICD-10-CM | POA: Diagnosis not present

## 2019-06-23 DIAGNOSIS — E1169 Type 2 diabetes mellitus with other specified complication: Secondary | ICD-10-CM | POA: Diagnosis not present

## 2019-06-23 DIAGNOSIS — K219 Gastro-esophageal reflux disease without esophagitis: Secondary | ICD-10-CM | POA: Diagnosis not present

## 2019-10-27 DIAGNOSIS — I10 Essential (primary) hypertension: Secondary | ICD-10-CM | POA: Diagnosis not present

## 2019-10-27 DIAGNOSIS — Z1331 Encounter for screening for depression: Secondary | ICD-10-CM | POA: Diagnosis not present

## 2019-10-27 DIAGNOSIS — E785 Hyperlipidemia, unspecified: Secondary | ICD-10-CM | POA: Diagnosis not present

## 2019-10-27 DIAGNOSIS — E1169 Type 2 diabetes mellitus with other specified complication: Secondary | ICD-10-CM | POA: Diagnosis not present

## 2019-11-15 DIAGNOSIS — Z01419 Encounter for gynecological examination (general) (routine) without abnormal findings: Secondary | ICD-10-CM | POA: Diagnosis not present

## 2019-11-15 DIAGNOSIS — Z90710 Acquired absence of both cervix and uterus: Secondary | ICD-10-CM | POA: Diagnosis not present

## 2019-11-15 DIAGNOSIS — Z6841 Body Mass Index (BMI) 40.0 and over, adult: Secondary | ICD-10-CM | POA: Diagnosis not present

## 2019-11-15 DIAGNOSIS — Z779 Other contact with and (suspected) exposures hazardous to health: Secondary | ICD-10-CM | POA: Diagnosis not present

## 2019-11-24 DIAGNOSIS — B029 Zoster without complications: Secondary | ICD-10-CM | POA: Diagnosis not present

## 2019-11-24 DIAGNOSIS — E1169 Type 2 diabetes mellitus with other specified complication: Secondary | ICD-10-CM | POA: Diagnosis not present

## 2019-12-13 DIAGNOSIS — Z1231 Encounter for screening mammogram for malignant neoplasm of breast: Secondary | ICD-10-CM | POA: Diagnosis not present

## 2019-12-26 DIAGNOSIS — Z85828 Personal history of other malignant neoplasm of skin: Secondary | ICD-10-CM | POA: Diagnosis not present

## 2019-12-26 DIAGNOSIS — L989 Disorder of the skin and subcutaneous tissue, unspecified: Secondary | ICD-10-CM | POA: Diagnosis not present

## 2019-12-26 DIAGNOSIS — C50912 Malignant neoplasm of unspecified site of left female breast: Secondary | ICD-10-CM | POA: Diagnosis not present

## 2019-12-26 DIAGNOSIS — Z8589 Personal history of malignant neoplasm of other organs and systems: Secondary | ICD-10-CM | POA: Diagnosis not present

## 2020-01-06 DIAGNOSIS — H26492 Other secondary cataract, left eye: Secondary | ICD-10-CM | POA: Diagnosis not present

## 2020-01-30 DIAGNOSIS — C4491 Basal cell carcinoma of skin, unspecified: Secondary | ICD-10-CM | POA: Diagnosis not present

## 2020-01-30 DIAGNOSIS — C44612 Basal cell carcinoma of skin of right upper limb, including shoulder: Secondary | ICD-10-CM | POA: Diagnosis not present

## 2020-01-30 DIAGNOSIS — Z8589 Personal history of malignant neoplasm of other organs and systems: Secondary | ICD-10-CM | POA: Diagnosis not present

## 2020-01-30 DIAGNOSIS — Z85828 Personal history of other malignant neoplasm of skin: Secondary | ICD-10-CM | POA: Diagnosis not present

## 2020-01-30 DIAGNOSIS — L989 Disorder of the skin and subcutaneous tissue, unspecified: Secondary | ICD-10-CM | POA: Diagnosis not present

## 2020-02-17 DIAGNOSIS — E7849 Other hyperlipidemia: Secondary | ICD-10-CM | POA: Diagnosis not present

## 2020-02-17 DIAGNOSIS — E1169 Type 2 diabetes mellitus with other specified complication: Secondary | ICD-10-CM | POA: Diagnosis not present

## 2020-02-23 DIAGNOSIS — K219 Gastro-esophageal reflux disease without esophagitis: Secondary | ICD-10-CM | POA: Diagnosis not present

## 2020-02-23 DIAGNOSIS — I10 Essential (primary) hypertension: Secondary | ICD-10-CM | POA: Diagnosis not present

## 2020-02-23 DIAGNOSIS — Z0000 Encounter for general adult medical examination without abnormal findings: Secondary | ICD-10-CM | POA: Diagnosis not present

## 2020-02-23 DIAGNOSIS — Z23 Encounter for immunization: Secondary | ICD-10-CM | POA: Diagnosis not present

## 2020-02-23 DIAGNOSIS — R82998 Other abnormal findings in urine: Secondary | ICD-10-CM | POA: Diagnosis not present

## 2020-02-23 DIAGNOSIS — E1169 Type 2 diabetes mellitus with other specified complication: Secondary | ICD-10-CM | POA: Diagnosis not present

## 2020-02-23 DIAGNOSIS — E785 Hyperlipidemia, unspecified: Secondary | ICD-10-CM | POA: Diagnosis not present

## 2020-02-23 DIAGNOSIS — R269 Unspecified abnormalities of gait and mobility: Secondary | ICD-10-CM | POA: Diagnosis not present

## 2020-02-27 DIAGNOSIS — Z1212 Encounter for screening for malignant neoplasm of rectum: Secondary | ICD-10-CM | POA: Diagnosis not present

## 2020-06-21 DIAGNOSIS — E669 Obesity, unspecified: Secondary | ICD-10-CM | POA: Diagnosis not present

## 2020-06-21 DIAGNOSIS — I10 Essential (primary) hypertension: Secondary | ICD-10-CM | POA: Diagnosis not present

## 2020-06-21 DIAGNOSIS — E1169 Type 2 diabetes mellitus with other specified complication: Secondary | ICD-10-CM | POA: Diagnosis not present

## 2020-10-24 DIAGNOSIS — E1169 Type 2 diabetes mellitus with other specified complication: Secondary | ICD-10-CM | POA: Diagnosis not present

## 2020-10-24 DIAGNOSIS — I10 Essential (primary) hypertension: Secondary | ICD-10-CM | POA: Diagnosis not present

## 2020-10-24 DIAGNOSIS — E669 Obesity, unspecified: Secondary | ICD-10-CM | POA: Diagnosis not present

## 2020-11-30 DIAGNOSIS — Z1231 Encounter for screening mammogram for malignant neoplasm of breast: Secondary | ICD-10-CM | POA: Diagnosis not present

## 2020-11-30 DIAGNOSIS — Z853 Personal history of malignant neoplasm of breast: Secondary | ICD-10-CM | POA: Diagnosis not present

## 2020-12-11 DIAGNOSIS — Z779 Other contact with and (suspected) exposures hazardous to health: Secondary | ICD-10-CM | POA: Diagnosis not present

## 2020-12-11 DIAGNOSIS — Z6838 Body mass index (BMI) 38.0-38.9, adult: Secondary | ICD-10-CM | POA: Diagnosis not present

## 2020-12-11 DIAGNOSIS — Z124 Encounter for screening for malignant neoplasm of cervix: Secondary | ICD-10-CM | POA: Diagnosis not present

## 2020-12-11 DIAGNOSIS — Z1272 Encounter for screening for malignant neoplasm of vagina: Secondary | ICD-10-CM | POA: Diagnosis not present

## 2020-12-11 DIAGNOSIS — Z01419 Encounter for gynecological examination (general) (routine) without abnormal findings: Secondary | ICD-10-CM | POA: Diagnosis not present

## 2020-12-18 DIAGNOSIS — H40023 Open angle with borderline findings, high risk, bilateral: Secondary | ICD-10-CM | POA: Diagnosis not present

## 2020-12-28 DIAGNOSIS — L0390 Cellulitis, unspecified: Secondary | ICD-10-CM | POA: Diagnosis not present

## 2021-02-04 DIAGNOSIS — Z853 Personal history of malignant neoplasm of breast: Secondary | ICD-10-CM | POA: Diagnosis not present

## 2021-02-04 DIAGNOSIS — Z85828 Personal history of other malignant neoplasm of skin: Secondary | ICD-10-CM | POA: Diagnosis not present

## 2021-04-03 DIAGNOSIS — E1169 Type 2 diabetes mellitus with other specified complication: Secondary | ICD-10-CM | POA: Diagnosis not present

## 2021-04-03 DIAGNOSIS — E785 Hyperlipidemia, unspecified: Secondary | ICD-10-CM | POA: Diagnosis not present

## 2021-04-03 DIAGNOSIS — I10 Essential (primary) hypertension: Secondary | ICD-10-CM | POA: Diagnosis not present

## 2021-04-10 DIAGNOSIS — Z1339 Encounter for screening examination for other mental health and behavioral disorders: Secondary | ICD-10-CM | POA: Diagnosis not present

## 2021-04-10 DIAGNOSIS — E1169 Type 2 diabetes mellitus with other specified complication: Secondary | ICD-10-CM | POA: Diagnosis not present

## 2021-04-10 DIAGNOSIS — Z23 Encounter for immunization: Secondary | ICD-10-CM | POA: Diagnosis not present

## 2021-04-10 DIAGNOSIS — K219 Gastro-esophageal reflux disease without esophagitis: Secondary | ICD-10-CM | POA: Diagnosis not present

## 2021-04-10 DIAGNOSIS — Z0000 Encounter for general adult medical examination without abnormal findings: Secondary | ICD-10-CM | POA: Diagnosis not present

## 2021-04-10 DIAGNOSIS — R82998 Other abnormal findings in urine: Secondary | ICD-10-CM | POA: Diagnosis not present

## 2021-04-10 DIAGNOSIS — Z1331 Encounter for screening for depression: Secondary | ICD-10-CM | POA: Diagnosis not present

## 2021-04-10 DIAGNOSIS — E669 Obesity, unspecified: Secondary | ICD-10-CM | POA: Diagnosis not present

## 2021-04-10 DIAGNOSIS — I10 Essential (primary) hypertension: Secondary | ICD-10-CM | POA: Diagnosis not present

## 2021-04-10 DIAGNOSIS — Z1212 Encounter for screening for malignant neoplasm of rectum: Secondary | ICD-10-CM | POA: Diagnosis not present

## 2021-04-10 DIAGNOSIS — R269 Unspecified abnormalities of gait and mobility: Secondary | ICD-10-CM | POA: Diagnosis not present

## 2021-04-22 DIAGNOSIS — I10 Essential (primary) hypertension: Secondary | ICD-10-CM | POA: Diagnosis not present

## 2021-06-26 DIAGNOSIS — H25813 Combined forms of age-related cataract, bilateral: Secondary | ICD-10-CM | POA: Diagnosis not present

## 2021-06-26 DIAGNOSIS — H40013 Open angle with borderline findings, low risk, bilateral: Secondary | ICD-10-CM | POA: Diagnosis not present

## 2021-08-12 DIAGNOSIS — K219 Gastro-esophageal reflux disease without esophagitis: Secondary | ICD-10-CM | POA: Diagnosis not present

## 2021-08-12 DIAGNOSIS — E1169 Type 2 diabetes mellitus with other specified complication: Secondary | ICD-10-CM | POA: Diagnosis not present

## 2021-08-12 DIAGNOSIS — E669 Obesity, unspecified: Secondary | ICD-10-CM | POA: Diagnosis not present

## 2021-08-12 DIAGNOSIS — I10 Essential (primary) hypertension: Secondary | ICD-10-CM | POA: Diagnosis not present

## 2021-12-02 DIAGNOSIS — Z1231 Encounter for screening mammogram for malignant neoplasm of breast: Secondary | ICD-10-CM | POA: Diagnosis not present

## 2021-12-23 DIAGNOSIS — I10 Essential (primary) hypertension: Secondary | ICD-10-CM | POA: Diagnosis not present

## 2021-12-23 DIAGNOSIS — E1169 Type 2 diabetes mellitus with other specified complication: Secondary | ICD-10-CM | POA: Diagnosis not present

## 2021-12-23 DIAGNOSIS — E669 Obesity, unspecified: Secondary | ICD-10-CM | POA: Diagnosis not present

## 2021-12-23 DIAGNOSIS — E785 Hyperlipidemia, unspecified: Secondary | ICD-10-CM | POA: Diagnosis not present

## 2021-12-25 DIAGNOSIS — H40053 Ocular hypertension, bilateral: Secondary | ICD-10-CM | POA: Diagnosis not present

## 2021-12-25 DIAGNOSIS — H43811 Vitreous degeneration, right eye: Secondary | ICD-10-CM | POA: Diagnosis not present

## 2021-12-25 DIAGNOSIS — H25813 Combined forms of age-related cataract, bilateral: Secondary | ICD-10-CM | POA: Diagnosis not present

## 2021-12-30 DIAGNOSIS — Z853 Personal history of malignant neoplasm of breast: Secondary | ICD-10-CM | POA: Diagnosis not present

## 2022-01-27 DIAGNOSIS — Z8542 Personal history of malignant neoplasm of other parts of uterus: Secondary | ICD-10-CM | POA: Diagnosis not present

## 2022-01-27 DIAGNOSIS — Z6841 Body Mass Index (BMI) 40.0 and over, adult: Secondary | ICD-10-CM | POA: Diagnosis not present

## 2022-01-27 DIAGNOSIS — Z124 Encounter for screening for malignant neoplasm of cervix: Secondary | ICD-10-CM | POA: Diagnosis not present

## 2022-01-27 DIAGNOSIS — Z853 Personal history of malignant neoplasm of breast: Secondary | ICD-10-CM | POA: Diagnosis not present

## 2022-01-27 DIAGNOSIS — Z1272 Encounter for screening for malignant neoplasm of vagina: Secondary | ICD-10-CM | POA: Diagnosis not present

## 2022-01-29 DIAGNOSIS — H25813 Combined forms of age-related cataract, bilateral: Secondary | ICD-10-CM | POA: Diagnosis not present

## 2022-01-29 DIAGNOSIS — H40053 Ocular hypertension, bilateral: Secondary | ICD-10-CM | POA: Diagnosis not present

## 2022-04-28 DIAGNOSIS — R7989 Other specified abnormal findings of blood chemistry: Secondary | ICD-10-CM | POA: Diagnosis not present

## 2022-04-28 DIAGNOSIS — I10 Essential (primary) hypertension: Secondary | ICD-10-CM | POA: Diagnosis not present

## 2022-04-28 DIAGNOSIS — R739 Hyperglycemia, unspecified: Secondary | ICD-10-CM | POA: Diagnosis not present

## 2022-04-28 DIAGNOSIS — E785 Hyperlipidemia, unspecified: Secondary | ICD-10-CM | POA: Diagnosis not present

## 2022-05-01 DIAGNOSIS — R82998 Other abnormal findings in urine: Secondary | ICD-10-CM | POA: Diagnosis not present

## 2022-05-05 DIAGNOSIS — R269 Unspecified abnormalities of gait and mobility: Secondary | ICD-10-CM | POA: Diagnosis not present

## 2022-05-05 DIAGNOSIS — E785 Hyperlipidemia, unspecified: Secondary | ICD-10-CM | POA: Diagnosis not present

## 2022-05-05 DIAGNOSIS — Z1339 Encounter for screening examination for other mental health and behavioral disorders: Secondary | ICD-10-CM | POA: Diagnosis not present

## 2022-05-05 DIAGNOSIS — I10 Essential (primary) hypertension: Secondary | ICD-10-CM | POA: Diagnosis not present

## 2022-05-05 DIAGNOSIS — Z1331 Encounter for screening for depression: Secondary | ICD-10-CM | POA: Diagnosis not present

## 2022-05-05 DIAGNOSIS — K219 Gastro-esophageal reflux disease without esophagitis: Secondary | ICD-10-CM | POA: Diagnosis not present

## 2022-05-05 DIAGNOSIS — E1169 Type 2 diabetes mellitus with other specified complication: Secondary | ICD-10-CM | POA: Diagnosis not present

## 2022-05-05 DIAGNOSIS — Z0000 Encounter for general adult medical examination without abnormal findings: Secondary | ICD-10-CM | POA: Diagnosis not present

## 2022-05-05 DIAGNOSIS — E669 Obesity, unspecified: Secondary | ICD-10-CM | POA: Diagnosis not present

## 2022-05-05 DIAGNOSIS — R82998 Other abnormal findings in urine: Secondary | ICD-10-CM | POA: Diagnosis not present

## 2022-05-21 DIAGNOSIS — Z0000 Encounter for general adult medical examination without abnormal findings: Secondary | ICD-10-CM | POA: Diagnosis not present

## 2022-06-02 DIAGNOSIS — H25813 Combined forms of age-related cataract, bilateral: Secondary | ICD-10-CM | POA: Diagnosis not present

## 2022-06-02 DIAGNOSIS — H40053 Ocular hypertension, bilateral: Secondary | ICD-10-CM | POA: Diagnosis not present

## 2022-08-18 DIAGNOSIS — I10 Essential (primary) hypertension: Secondary | ICD-10-CM | POA: Diagnosis not present

## 2022-08-18 DIAGNOSIS — E669 Obesity, unspecified: Secondary | ICD-10-CM | POA: Diagnosis not present

## 2022-08-18 DIAGNOSIS — E1169 Type 2 diabetes mellitus with other specified complication: Secondary | ICD-10-CM | POA: Diagnosis not present

## 2022-10-31 DIAGNOSIS — H40053 Ocular hypertension, bilateral: Secondary | ICD-10-CM | POA: Diagnosis not present

## 2022-10-31 DIAGNOSIS — H25813 Combined forms of age-related cataract, bilateral: Secondary | ICD-10-CM | POA: Diagnosis not present

## 2022-11-10 DIAGNOSIS — D0462 Carcinoma in situ of skin of left upper limb, including shoulder: Secondary | ICD-10-CM | POA: Diagnosis not present

## 2022-11-10 DIAGNOSIS — D0472 Carcinoma in situ of skin of left lower limb, including hip: Secondary | ICD-10-CM | POA: Diagnosis not present

## 2022-11-10 DIAGNOSIS — L570 Actinic keratosis: Secondary | ICD-10-CM | POA: Diagnosis not present

## 2022-11-10 DIAGNOSIS — L565 Disseminated superficial actinic porokeratosis (DSAP): Secondary | ICD-10-CM | POA: Diagnosis not present

## 2022-11-10 DIAGNOSIS — L578 Other skin changes due to chronic exposure to nonionizing radiation: Secondary | ICD-10-CM | POA: Diagnosis not present

## 2022-11-10 DIAGNOSIS — D2272 Melanocytic nevi of left lower limb, including hip: Secondary | ICD-10-CM | POA: Diagnosis not present

## 2022-11-10 DIAGNOSIS — D1801 Hemangioma of skin and subcutaneous tissue: Secondary | ICD-10-CM | POA: Diagnosis not present

## 2022-11-10 DIAGNOSIS — D225 Melanocytic nevi of trunk: Secondary | ICD-10-CM | POA: Diagnosis not present

## 2022-11-10 DIAGNOSIS — L821 Other seborrheic keratosis: Secondary | ICD-10-CM | POA: Diagnosis not present

## 2022-12-24 DIAGNOSIS — I10 Essential (primary) hypertension: Secondary | ICD-10-CM | POA: Diagnosis not present

## 2022-12-24 DIAGNOSIS — E1169 Type 2 diabetes mellitus with other specified complication: Secondary | ICD-10-CM | POA: Diagnosis not present

## 2022-12-24 DIAGNOSIS — E669 Obesity, unspecified: Secondary | ICD-10-CM | POA: Diagnosis not present

## 2022-12-31 DIAGNOSIS — Z1231 Encounter for screening mammogram for malignant neoplasm of breast: Secondary | ICD-10-CM | POA: Diagnosis not present

## 2023-01-08 DIAGNOSIS — Z853 Personal history of malignant neoplasm of breast: Secondary | ICD-10-CM | POA: Diagnosis not present

## 2023-01-08 DIAGNOSIS — Z85828 Personal history of other malignant neoplasm of skin: Secondary | ICD-10-CM | POA: Diagnosis not present

## 2023-02-02 DIAGNOSIS — Z1272 Encounter for screening for malignant neoplasm of vagina: Secondary | ICD-10-CM | POA: Diagnosis not present

## 2023-02-02 DIAGNOSIS — Z6841 Body Mass Index (BMI) 40.0 and over, adult: Secondary | ICD-10-CM | POA: Diagnosis not present

## 2023-02-02 DIAGNOSIS — Z124 Encounter for screening for malignant neoplasm of cervix: Secondary | ICD-10-CM | POA: Diagnosis not present

## 2023-02-19 DIAGNOSIS — L438 Other lichen planus: Secondary | ICD-10-CM | POA: Diagnosis not present

## 2023-02-19 DIAGNOSIS — L570 Actinic keratosis: Secondary | ICD-10-CM | POA: Diagnosis not present

## 2023-02-19 DIAGNOSIS — L738 Other specified follicular disorders: Secondary | ICD-10-CM | POA: Diagnosis not present

## 2023-03-04 DIAGNOSIS — H25813 Combined forms of age-related cataract, bilateral: Secondary | ICD-10-CM | POA: Diagnosis not present

## 2023-03-04 DIAGNOSIS — H43813 Vitreous degeneration, bilateral: Secondary | ICD-10-CM | POA: Diagnosis not present

## 2023-03-04 DIAGNOSIS — H40053 Ocular hypertension, bilateral: Secondary | ICD-10-CM | POA: Diagnosis not present

## 2023-05-05 DIAGNOSIS — I10 Essential (primary) hypertension: Secondary | ICD-10-CM | POA: Diagnosis not present

## 2023-05-05 DIAGNOSIS — E785 Hyperlipidemia, unspecified: Secondary | ICD-10-CM | POA: Diagnosis not present

## 2023-05-05 DIAGNOSIS — E1169 Type 2 diabetes mellitus with other specified complication: Secondary | ICD-10-CM | POA: Diagnosis not present

## 2023-05-11 DIAGNOSIS — Z0000 Encounter for general adult medical examination without abnormal findings: Secondary | ICD-10-CM | POA: Diagnosis not present

## 2023-05-11 DIAGNOSIS — R82998 Other abnormal findings in urine: Secondary | ICD-10-CM | POA: Diagnosis not present

## 2023-05-11 DIAGNOSIS — Z1212 Encounter for screening for malignant neoplasm of rectum: Secondary | ICD-10-CM | POA: Diagnosis not present

## 2023-05-11 DIAGNOSIS — I10 Essential (primary) hypertension: Secondary | ICD-10-CM | POA: Diagnosis not present

## 2023-05-11 DIAGNOSIS — E785 Hyperlipidemia, unspecified: Secondary | ICD-10-CM | POA: Diagnosis not present

## 2023-05-11 DIAGNOSIS — E1169 Type 2 diabetes mellitus with other specified complication: Secondary | ICD-10-CM | POA: Diagnosis not present

## 2023-05-11 DIAGNOSIS — Z1331 Encounter for screening for depression: Secondary | ICD-10-CM | POA: Diagnosis not present

## 2023-05-11 DIAGNOSIS — Z1339 Encounter for screening examination for other mental health and behavioral disorders: Secondary | ICD-10-CM | POA: Diagnosis not present

## 2023-05-14 DIAGNOSIS — D1801 Hemangioma of skin and subcutaneous tissue: Secondary | ICD-10-CM | POA: Diagnosis not present

## 2023-05-14 DIAGNOSIS — L820 Inflamed seborrheic keratosis: Secondary | ICD-10-CM | POA: Diagnosis not present

## 2023-05-14 DIAGNOSIS — L918 Other hypertrophic disorders of the skin: Secondary | ICD-10-CM | POA: Diagnosis not present

## 2023-05-14 DIAGNOSIS — L821 Other seborrheic keratosis: Secondary | ICD-10-CM | POA: Diagnosis not present

## 2023-05-14 DIAGNOSIS — D2239 Melanocytic nevi of other parts of face: Secondary | ICD-10-CM | POA: Diagnosis not present

## 2023-05-14 DIAGNOSIS — L570 Actinic keratosis: Secondary | ICD-10-CM | POA: Diagnosis not present

## 2023-05-14 DIAGNOSIS — D225 Melanocytic nevi of trunk: Secondary | ICD-10-CM | POA: Diagnosis not present

## 2023-08-26 DIAGNOSIS — K219 Gastro-esophageal reflux disease without esophagitis: Secondary | ICD-10-CM | POA: Diagnosis not present

## 2023-08-26 DIAGNOSIS — I10 Essential (primary) hypertension: Secondary | ICD-10-CM | POA: Diagnosis not present

## 2023-08-26 DIAGNOSIS — R269 Unspecified abnormalities of gait and mobility: Secondary | ICD-10-CM | POA: Diagnosis not present

## 2023-08-26 DIAGNOSIS — E785 Hyperlipidemia, unspecified: Secondary | ICD-10-CM | POA: Diagnosis not present

## 2023-08-26 DIAGNOSIS — E1169 Type 2 diabetes mellitus with other specified complication: Secondary | ICD-10-CM | POA: Diagnosis not present

## 2023-09-22 DIAGNOSIS — R509 Fever, unspecified: Secondary | ICD-10-CM | POA: Diagnosis not present

## 2023-09-22 DIAGNOSIS — J209 Acute bronchitis, unspecified: Secondary | ICD-10-CM | POA: Diagnosis not present

## 2023-09-22 DIAGNOSIS — J020 Streptococcal pharyngitis: Secondary | ICD-10-CM | POA: Diagnosis not present

## 2023-10-21 DIAGNOSIS — H40053 Ocular hypertension, bilateral: Secondary | ICD-10-CM | POA: Diagnosis not present

## 2023-10-21 DIAGNOSIS — H25813 Combined forms of age-related cataract, bilateral: Secondary | ICD-10-CM | POA: Diagnosis not present

## 2023-12-14 DIAGNOSIS — L218 Other seborrheic dermatitis: Secondary | ICD-10-CM | POA: Diagnosis not present

## 2023-12-14 DIAGNOSIS — L821 Other seborrheic keratosis: Secondary | ICD-10-CM | POA: Diagnosis not present

## 2023-12-14 DIAGNOSIS — L304 Erythema intertrigo: Secondary | ICD-10-CM | POA: Diagnosis not present

## 2023-12-14 DIAGNOSIS — D1801 Hemangioma of skin and subcutaneous tissue: Secondary | ICD-10-CM | POA: Diagnosis not present

## 2023-12-14 DIAGNOSIS — L565 Disseminated superficial actinic porokeratosis (DSAP): Secondary | ICD-10-CM | POA: Diagnosis not present

## 2023-12-14 DIAGNOSIS — L570 Actinic keratosis: Secondary | ICD-10-CM | POA: Diagnosis not present

## 2023-12-23 DIAGNOSIS — E785 Hyperlipidemia, unspecified: Secondary | ICD-10-CM | POA: Diagnosis not present

## 2023-12-23 DIAGNOSIS — R269 Unspecified abnormalities of gait and mobility: Secondary | ICD-10-CM | POA: Diagnosis not present

## 2023-12-23 DIAGNOSIS — K219 Gastro-esophageal reflux disease without esophagitis: Secondary | ICD-10-CM | POA: Diagnosis not present

## 2023-12-23 DIAGNOSIS — E1169 Type 2 diabetes mellitus with other specified complication: Secondary | ICD-10-CM | POA: Diagnosis not present

## 2023-12-23 DIAGNOSIS — E669 Obesity, unspecified: Secondary | ICD-10-CM | POA: Diagnosis not present

## 2023-12-23 DIAGNOSIS — I10 Essential (primary) hypertension: Secondary | ICD-10-CM | POA: Diagnosis not present

## 2024-01-28 DIAGNOSIS — Z1231 Encounter for screening mammogram for malignant neoplasm of breast: Secondary | ICD-10-CM | POA: Diagnosis not present

## 2024-02-04 DIAGNOSIS — Z85828 Personal history of other malignant neoplasm of skin: Secondary | ICD-10-CM | POA: Diagnosis not present

## 2024-02-04 DIAGNOSIS — Z853 Personal history of malignant neoplasm of breast: Secondary | ICD-10-CM | POA: Diagnosis not present

## 2024-02-15 DIAGNOSIS — Z853 Personal history of malignant neoplasm of breast: Secondary | ICD-10-CM | POA: Diagnosis not present

## 2024-02-15 DIAGNOSIS — C55 Malignant neoplasm of uterus, part unspecified: Secondary | ICD-10-CM | POA: Diagnosis not present

## 2024-02-15 DIAGNOSIS — Z1272 Encounter for screening for malignant neoplasm of vagina: Secondary | ICD-10-CM | POA: Diagnosis not present

## 2024-02-15 DIAGNOSIS — Z779 Other contact with and (suspected) exposures hazardous to health: Secondary | ICD-10-CM | POA: Diagnosis not present

## 2024-02-15 DIAGNOSIS — Z6841 Body Mass Index (BMI) 40.0 and over, adult: Secondary | ICD-10-CM | POA: Diagnosis not present

## 2024-04-20 DIAGNOSIS — H25813 Combined forms of age-related cataract, bilateral: Secondary | ICD-10-CM | POA: Diagnosis not present

## 2024-04-20 DIAGNOSIS — H40053 Ocular hypertension, bilateral: Secondary | ICD-10-CM | POA: Diagnosis not present

## 2024-04-20 DIAGNOSIS — H43813 Vitreous degeneration, bilateral: Secondary | ICD-10-CM | POA: Diagnosis not present

## 2024-05-25 DIAGNOSIS — E785 Hyperlipidemia, unspecified: Secondary | ICD-10-CM | POA: Diagnosis not present

## 2024-05-25 DIAGNOSIS — I10 Essential (primary) hypertension: Secondary | ICD-10-CM | POA: Diagnosis not present

## 2024-05-25 DIAGNOSIS — E1169 Type 2 diabetes mellitus with other specified complication: Secondary | ICD-10-CM | POA: Diagnosis not present

## 2024-06-05 DIAGNOSIS — Z1212 Encounter for screening for malignant neoplasm of rectum: Secondary | ICD-10-CM | POA: Diagnosis not present

## 2024-06-06 DIAGNOSIS — Z1331 Encounter for screening for depression: Secondary | ICD-10-CM | POA: Diagnosis not present

## 2024-06-06 DIAGNOSIS — I10 Essential (primary) hypertension: Secondary | ICD-10-CM | POA: Diagnosis not present

## 2024-06-06 DIAGNOSIS — Z1339 Encounter for screening examination for other mental health and behavioral disorders: Secondary | ICD-10-CM | POA: Diagnosis not present

## 2024-06-06 DIAGNOSIS — R269 Unspecified abnormalities of gait and mobility: Secondary | ICD-10-CM | POA: Diagnosis not present

## 2024-06-06 DIAGNOSIS — K219 Gastro-esophageal reflux disease without esophagitis: Secondary | ICD-10-CM | POA: Diagnosis not present

## 2024-06-06 DIAGNOSIS — Z0000 Encounter for general adult medical examination without abnormal findings: Secondary | ICD-10-CM | POA: Diagnosis not present

## 2024-06-06 DIAGNOSIS — E1169 Type 2 diabetes mellitus with other specified complication: Secondary | ICD-10-CM | POA: Diagnosis not present

## 2024-06-06 DIAGNOSIS — R82998 Other abnormal findings in urine: Secondary | ICD-10-CM | POA: Diagnosis not present

## 2024-06-06 DIAGNOSIS — E785 Hyperlipidemia, unspecified: Secondary | ICD-10-CM | POA: Diagnosis not present

## 2024-06-06 DIAGNOSIS — Z23 Encounter for immunization: Secondary | ICD-10-CM | POA: Diagnosis not present

## 2024-06-15 DIAGNOSIS — I10 Essential (primary) hypertension: Secondary | ICD-10-CM | POA: Diagnosis not present

## 2024-06-15 DIAGNOSIS — R82998 Other abnormal findings in urine: Secondary | ICD-10-CM | POA: Diagnosis not present

## 2024-07-04 DIAGNOSIS — L565 Disseminated superficial actinic porokeratosis (DSAP): Secondary | ICD-10-CM | POA: Diagnosis not present

## 2024-07-04 DIAGNOSIS — L821 Other seborrheic keratosis: Secondary | ICD-10-CM | POA: Diagnosis not present

## 2024-07-04 DIAGNOSIS — L281 Prurigo nodularis: Secondary | ICD-10-CM | POA: Diagnosis not present

## 2024-07-04 DIAGNOSIS — L820 Inflamed seborrheic keratosis: Secondary | ICD-10-CM | POA: Diagnosis not present

## 2024-07-04 DIAGNOSIS — L918 Other hypertrophic disorders of the skin: Secondary | ICD-10-CM | POA: Diagnosis not present

## 2024-07-04 DIAGNOSIS — L570 Actinic keratosis: Secondary | ICD-10-CM | POA: Diagnosis not present

## 2024-07-04 DIAGNOSIS — D1801 Hemangioma of skin and subcutaneous tissue: Secondary | ICD-10-CM | POA: Diagnosis not present

## 2024-08-11 DIAGNOSIS — E1169 Type 2 diabetes mellitus with other specified complication: Secondary | ICD-10-CM | POA: Diagnosis not present

## 2024-08-11 DIAGNOSIS — R002 Palpitations: Secondary | ICD-10-CM | POA: Diagnosis not present

## 2024-08-11 DIAGNOSIS — E785 Hyperlipidemia, unspecified: Secondary | ICD-10-CM | POA: Diagnosis not present

## 2024-08-11 DIAGNOSIS — E669 Obesity, unspecified: Secondary | ICD-10-CM | POA: Diagnosis not present

## 2024-08-11 DIAGNOSIS — K219 Gastro-esophageal reflux disease without esophagitis: Secondary | ICD-10-CM | POA: Diagnosis not present

## 2024-08-11 DIAGNOSIS — I10 Essential (primary) hypertension: Secondary | ICD-10-CM | POA: Diagnosis not present

## 2024-08-11 DIAGNOSIS — R269 Unspecified abnormalities of gait and mobility: Secondary | ICD-10-CM | POA: Diagnosis not present
